# Patient Record
Sex: Male | Born: 1970 | Race: White | Hispanic: No | Marital: Single | Smoking: Former smoker
Health system: Southern US, Community
[De-identification: ages and names within clinical notes are randomized; demographics above are authoritative.]

## PROBLEM LIST (undated history)

## (undated) DIAGNOSIS — F419 Anxiety disorder, unspecified: Secondary | ICD-10-CM

## (undated) DIAGNOSIS — G4730 Sleep apnea, unspecified: Secondary | ICD-10-CM

## (undated) DIAGNOSIS — F32A Depression, unspecified: Secondary | ICD-10-CM

## (undated) DIAGNOSIS — Z8619 Personal history of other infectious and parasitic diseases: Secondary | ICD-10-CM

## (undated) DIAGNOSIS — F329 Major depressive disorder, single episode, unspecified: Secondary | ICD-10-CM

## (undated) HISTORY — PX: HERNIA REPAIR: SHX51

## (undated) HISTORY — DX: Sleep apnea, unspecified: G47.30

## (undated) HISTORY — DX: Depression, unspecified: F32.A

## (undated) HISTORY — DX: Personal history of other infectious and parasitic diseases: Z86.19

## (undated) HISTORY — DX: Anxiety disorder, unspecified: F41.9

## (undated) HISTORY — DX: Major depressive disorder, single episode, unspecified: F32.9

## (undated) HISTORY — PX: TONSILLECTOMY: SUR1361

## (undated) HISTORY — PX: INGUINAL HERNIA REPAIR: SUR1180

## (undated) HISTORY — PX: EYE SURGERY: SHX253

---

## 2001-10-10 ENCOUNTER — Encounter: Payer: Self-pay | Admitting: Emergency Medicine

## 2001-10-10 ENCOUNTER — Emergency Department (HOSPITAL_COMMUNITY): Admission: EM | Admit: 2001-10-10 | Discharge: 2001-10-10 | Payer: Self-pay | Admitting: Emergency Medicine

## 2014-04-23 ENCOUNTER — Emergency Department (HOSPITAL_BASED_OUTPATIENT_CLINIC_OR_DEPARTMENT_OTHER)
Admission: EM | Admit: 2014-04-23 | Discharge: 2014-04-23 | Disposition: A | Discharge status: Home / Self Care | Payer: BC Managed Care – PPO | Attending: Emergency Medicine | Admitting: Emergency Medicine

## 2014-04-23 ENCOUNTER — Encounter (HOSPITAL_BASED_OUTPATIENT_CLINIC_OR_DEPARTMENT_OTHER): Payer: Self-pay

## 2014-04-23 DIAGNOSIS — Z79899 Other long term (current) drug therapy: Secondary | ICD-10-CM | POA: Diagnosis not present

## 2014-04-23 DIAGNOSIS — E669 Obesity, unspecified: Secondary | ICD-10-CM | POA: Insufficient documentation

## 2014-04-23 DIAGNOSIS — K625 Hemorrhage of anus and rectum: Secondary | ICD-10-CM | POA: Insufficient documentation

## 2014-04-23 DIAGNOSIS — Z720 Tobacco use: Secondary | ICD-10-CM | POA: Insufficient documentation

## 2014-04-23 NOTE — ED Notes (Signed)
Pt reports straining while having a BM earlier. Reports there was blood in the toilet and on toilet paper.

## 2014-04-23 NOTE — Discharge Instructions (Signed)
Rectal Bleeding °Rectal bleeding is when blood passes out of the anus. It is usually a sign that something is wrong. It may not be serious, but it should always be evaluated. Rectal bleeding may present as bright red blood or extremely dark stools. The color may range from dark red or maroon to black (like tar). It is important that the cause of rectal bleeding be identified so treatment can be started and the problem corrected. °CAUSES  °· Hemorrhoids. These are enlarged (dilated) blood vessels or veins in the anal or rectal area. °· Fistulas. These are abnormal, burrowing channels that usually run from inside the rectum to the skin around the anus. They can bleed. °· Anal fissures. This is a tear in the tissue of the anus. Bleeding occurs with bowel movements. °· Diverticulosis. This is a condition in which pockets or sacs project from the bowel wall. Occasionally, the sacs can bleed. °· Diverticulitis. This is an infection involving diverticulosis of the colon. °· Proctitis and colitis. These are conditions in which the rectum, colon, or both, can become inflamed and pitted (ulcerated). °· Polyps and cancer. Polyps are non-cancerous (benign) growths in the colon that may bleed. Certain types of polyps turn into cancer. °· Protrusion of the rectum. Part of the rectum can project from the anus and bleed. °· Certain medicines. °· Intestinal infections. °· Blood vessel abnormalities. °HOME CARE INSTRUCTIONS °· Eat a high-fiber diet to keep your stool soft. °· Limit activity. °· Drink enough fluids to keep your urine clear or pale yellow. °· Warm baths may be useful to soothe rectal pain. °· Follow up with your caregiver as directed. °SEEK IMMEDIATE MEDICAL CARE IF: °· You develop increased bleeding. °· You have black or dark red stools. °· You vomit blood or material that looks like coffee grounds. °· You have abdominal pain or tenderness. °· You have a fever. °· You feel weak, nauseous, or you faint. °· You have  severe rectal pain or you are unable to have a bowel movement. °MAKE SURE YOU: °· Understand these instructions. °· Will watch your condition. °· Will get help right away if you are not doing well or get worse. °Document Released: 10/23/2001 Document Revised: 07/26/2011 Document Reviewed: 10/18/2010 °ExitCare® Patient Information ©2015 [employer]. This information is not intended to replace advice given to you by your health care provider. Make sure you discuss any questions you have with your health care provider. ° °

## 2014-04-23 NOTE — ED Provider Notes (Signed)
CSN: [id number]     Arrival date & time 04/23/14  1625 History   First MD Initiated Contact with Patient 04/23/14 1641     Chief Complaint  Patient presents with  . Rectal Bleeding      HPI Patient reports having a normal bowel movement this morning except that it was followed by a small amount of blood in the toilet as well as blood on the tissue.  No rectal bleeding since then.  No prior history of rectal bleeding.  Denies constipation.  States his stool was brown in color.  Denies abdominal pain.  No weakness or lightheadedness.  No use of anticoagulants.  No other complaints.   History reviewed. No pertinent past medical history. Past Surgical History  Procedure Laterality Date  . Eye surgery    . Tonsillectomy     No family history on file. History  Substance Use Topics  . Smoking status: Current Every Day Smoker  . Smokeless tobacco: Not on file  . Alcohol Use: No    Review of Systems  All other systems reviewed and are negative.     Allergies  Review of patient's allergies indicates no known allergies.  Home Medications   Prior to Admission medications   Medication Sig Start Date End Date Taking? Authorizing Provider  venlafaxine (EFFEXOR) 100 MG tablet Take 100 mg by mouth 2 (two) times daily.   Yes Historical Provider, MD   BP 131/76 mmHg  Pulse 99  Temp(Src) 98.2 F (36.8 C) (Oral)  Resp 20  Ht 5\' 10"  (1.778 m)  Wt 280 lb (127.007 kg)  BMI 40.18 kg/m2  SpO2 97% Physical Exam  Constitutional: He is oriented to person, place, and time. He appears well-developed and well-nourished.  HENT:  Head: Normocephalic and atraumatic.  Eyes: EOM are normal.  Neck: Normal range of motion.  Cardiovascular: Normal rate.   Pulmonary/Chest: Effort normal.  Abdominal: Soft. He exhibits no distension. There is no tenderness.  Obese  Genitourinary:  Small fissure noted without active bleeding.  No external hemorrhoids noted.  No masses noted on rectal examination.   No gross blood.  Brown stool.  Musculoskeletal: Normal range of motion.  Neurological: He is alert and oriented to person, place, and time.  Skin: Skin is warm and dry.  Psychiatric: He has a normal mood and affect. Judgment normal.  Nursing note and vitals reviewed.   ED Course  Procedures (including critical care time) Labs Review Labs Reviewed - No data to display  Imaging Review No results found.   EKG Interpretation None      MDM   Final diagnoses:  Rectal bleeding    Likely small internal hemorrhoid or small bleeding from fissure.  No active bleeding at this time.  Vital signs are normal.  Outpatient PCP follow-up.  GI referral only if his bleeding would be persistent.  Patient understands to return to the ER for new or worsening symptoms.    Lyanne CoKevin M Tiajah Oyster, MD 04/23/14 [phone]

## 2014-06-04 ENCOUNTER — Encounter: Payer: Self-pay | Admitting: Physician Assistant

## 2014-06-04 ENCOUNTER — Ambulatory Visit (INDEPENDENT_AMBULATORY_CARE_PROVIDER_SITE_OTHER): Payer: Self-pay | Admitting: Physician Assistant

## 2014-06-04 VITALS — BP 149/101 | HR 92 | Temp 98.2°F | Resp 18 | Ht 70.0 in | Wt 312.0 lb

## 2014-06-04 DIAGNOSIS — Z22322 Carrier or suspected carrier of Methicillin resistant Staphylococcus aureus: Secondary | ICD-10-CM | POA: Insufficient documentation

## 2014-06-04 DIAGNOSIS — R55 Syncope and collapse: Secondary | ICD-10-CM | POA: Insufficient documentation

## 2014-06-04 DIAGNOSIS — F329 Major depressive disorder, single episode, unspecified: Secondary | ICD-10-CM | POA: Insufficient documentation

## 2014-06-04 DIAGNOSIS — F32A Depression, unspecified: Secondary | ICD-10-CM | POA: Insufficient documentation

## 2014-06-04 DIAGNOSIS — F419 Anxiety disorder, unspecified: Principal | ICD-10-CM

## 2014-06-04 DIAGNOSIS — F418 Other specified anxiety disorders: Secondary | ICD-10-CM

## 2014-06-04 LAB — TSH: TSH: 1.55 u[IU]/mL (ref 0.35–4.50)

## 2014-06-04 LAB — BASIC METABOLIC PANEL
BUN: 16 mg/dL (ref 6–23)
CHLORIDE: 106 meq/L (ref 96–112)
CO2: 28 mEq/L (ref 19–32)
CREATININE: 1.01 mg/dL (ref 0.40–1.50)
Calcium: 9.4 mg/dL (ref 8.4–10.5)
GFR: 85.94 mL/min (ref >60.00)
Glucose, Bld: 98 mg/dL (ref 70–99)
Potassium: 4.2 mEq/L (ref 3.5–5.1)
Sodium: 136 mEq/L (ref 135–145)

## 2014-06-04 LAB — CBC
HCT: 49.4 % (ref 39.0–52.0)
Hemoglobin: 16.8 g/dL (ref 13.0–17.0)
MCHC: 34 g/dL (ref 30.0–36.0)
MCV: 92.5 fl (ref 78.0–100.0)
Platelets: 199 10*3/uL (ref 150.0–400.0)
RBC: 5.34 Mil/uL (ref 4.22–5.81)
RDW: 13.6 % (ref 11.5–15.5)
WBC: 5.4 10*3/uL (ref 4.0–10.5)

## 2014-06-04 MED ORDER — BUSPIRONE HCL 7.5 MG PO TABS: 7.5000 mg | ORAL_TABLET | Freq: Two times a day (BID) | ORAL | Status: DC

## 2014-06-04 MED ORDER — MUPIROCIN CALCIUM 2 % NA OINT
1.0000 | TOPICAL_OINTMENT | Freq: Two times a day (BID) | NASAL | Status: DC
Start: 2014-06-04 — End: 2015-01-14

## 2014-06-04 MED ORDER — MUPIROCIN 2 % EX OINT: 1.0000 "application " | TOPICAL_OINTMENT | Freq: Two times a day (BID) | CUTANEOUS | Status: DC

## 2014-06-04 NOTE — Progress Notes (Signed)
Pre visit review using our clinic review tool, if applicable. No additional management support is needed unless otherwise documented below in the visit note/SLS  

## 2014-06-04 NOTE — Patient Instructions (Signed)
Please continue the Effexor as directed.  Begin taking the BuSpar twice daily. I think this will help a lot with symptoms.  Follow-up with me for this in 1 month.  Return sooner if you notice any worsening of symptoms.   Please apply the Bactroban ointment as directed.  This will help get rid of MRSA colonization and reduce recurrence of skin lesions.  For the syncope, this seems related to over stimulation of the vagus nerve.  Stay well hydrated.  If you are starting to have a sneezing or coughing fit, I want your to sit down to reduce risk of syncope.  I think once anxiety is under control this will help some.  If still occurring, I will be sending you to a Neurologist for evaluation.  Vasovagal Syncope, Adult Syncope, commonly known as fainting, is a temporary loss of consciousness. It occurs when the blood flow to the brain is reduced. Vasovagal syncope (also called neurocardiogenic syncope) is a fainting spell in which the blood flow to the brain is reduced because of a sudden drop in heart rate and blood pressure. Vasovagal syncope occurs when the brain and the cardiovascular system (blood vessels) do not adequately communicate and respond to each other. This is the most common cause of fainting. It often occurs in response to fear or some other type of emotional or physical stress. The body has a reaction in which the heart starts beating too slowly or the blood vessels expand, reducing blood pressure. This type of fainting spell is generally considered harmless. However, injuries can occur if a person takes a sudden fall during a fainting spell.  CAUSES  Vasovagal syncope occurs when a person's blood pressure and heart rate decrease suddenly, usually in response to a trigger. Many things and situations can trigger an episode. Some of these include:   Pain.   Fear.   The sight of blood or medical procedures, such as blood being drawn from a vein.   Common activities, such as coughing,  swallowing, stretching, or going to the bathroom.   Emotional stress.   Prolonged standing, especially in a warm environment.   Lack of sleep or rest.   Prolonged lack of food.   Prolonged lack of fluids.   Recent illness.  The use of certain drugs that affect blood pressure, such as cocaine, alcohol, marijuana, inhalants, and opiates.  SYMPTOMS  Before the fainting episode, you may:   Feel dizzy or light headed.   Become pale.  Sense that you are going to faint.   Feel like the room is spinning.   Have tunnel vision, only seeing directly in front of you.   Feel sick to your stomach (nauseous).   See spots or slowly lose vision.   Hear ringing in your ears.   Have a headache.   Feel warm and sweaty.   Feel a sensation of pins and needles. During the fainting spell, you will generally be unconscious for no longer than a couple minutes before waking up and returning to normal. If you get up too quickly before your body can recover, you may faint again. Some twitching or jerky movements may occur during the fainting spell.  DIAGNOSIS  Your caregiver will ask about your symptoms, take a medical history, and perform a physical exam. Various tests may be done to rule out other causes of fainting. These may include blood tests and tests to check the heart, such as electrocardiography, echocardiography, and possibly an electrophysiology study. When other causes have  been ruled out, a test may be done to check the body's response to changes in position (tilt table test). TREATMENT  Most cases of vasovagal syncope do not require treatment. Your caregiver may recommend ways to avoid fainting triggers and may provide home strategies for preventing fainting. If you must be exposed to a possible trigger, you can drink additional fluids to help reduce your chances of having an episode of vasovagal syncope. If you have warning signs of an oncoming episode, you can respond  by positioning yourself favorably (lying down). If your fainting spells continue, you may be given medicines to prevent fainting. Some medicines may help make you more resistant to repeated episodes of vasovagal syncope. Special exercises or compression stockings may be recommended. In rare cases, the surgical placement of a pacemaker is considered. HOME CARE INSTRUCTIONS   Learn to identify the warning signs of vasovagal syncope.   Sit or lie down at the first warning sign of a fainting spell. If sitting, put your head down between your legs. If you lie down, swing your legs up in the air to increase blood flow to the brain.   Avoid hot tubs and saunas.  Avoid prolonged standing.  Drink enough fluids to keep your urine clear or pale yellow. Avoid caffeine.  Increase salt in your diet as directed by your caregiver.   If you have to stand for a long time, perform movements such as:   Crossing your legs.   Flexing and stretching your leg muscles.   Squatting.   Moving your legs.   Bending over.   Only take over-the-counter or prescription medicines as directed by your caregiver. Do not suddenly stop any medicines without asking your caregiver first. SEEK MEDICAL CARE IF:   Your fainting spells continue or happen more frequently in spite of treatment.   You lose consciousness for more than a couple minutes.  You have fainting spells during or after exercising or after being startled.   You have new symptoms that occur with the fainting spells, such as:   Shortness of breath.  Chest pain.   Irregular heartbeat.   You have episodes of twitching or jerky movements that last longer than a few seconds.  You have episodes of twitching or jerky movements without obvious fainting. SEEK IMMEDIATE MEDICAL CARE IF:   You have injuries or bleeding after a fainting spell.   You have episodes of twitching or jerky movements that last longer than 5 minutes.   You  have more than one spell of twitching or jerky movements before returning to consciousness after fainting. MAKE SURE YOU:   Understand these instructions.  Will watch your condition.  Will get help right away if you are not doing well or get worse. Document Released: 04/19/2012 Document Reviewed: 04/19/2012 [facility] Patient Information 2015 [place]Care, [place]. This information is not intended to replace advice given to you by your health care provider. Make sure you discuss any questions you have with your health care provider.

## 2014-06-04 NOTE — Progress Notes (Signed)
Patient presents to clinic today to establish care.  Acute Concerns: Patient complains of history of MRSA skin infection, usually presenting as a boil in the inguinal region.  Patient endorses a current lesion in the right inguinal area that is healing, but still tender. Denies fever, chills or malaise. Has underwent MRSA eradication therapy previously.  Patient also endorses intermittent episodes of syncope after fits of coughing or sneezing. Also endorses an episode while urinating. Denies lightheadedness, nausea or vomiting prior to these episodes. Denies anterograde and retrograde amnesia. Denies history of heart arrhythmia. Denies palpitations, chest pain or shortness of breath. Endorses he tries to stay well hydrated, but could do a better job. Endorses well-rounded diet. Symptoms first occurred greater than 2 years ago.  Chronic Issues: Anxiety and Depression -- patient currently on Effexor 150 mg daily. Endorses significant relief of depression with this medication. Denies much improvement in anxiety. Patient endorses generalized anxiety with occasional flareups in stressful situations. Denies panic attack. Denies somnolence on Effexor XR. Denies suicidal thought or ideation.  Past Medical History  Diagnosis Date  . History of chicken pox   . Anxiety and depression     Past Surgical History  Procedure Laterality Date  . Eye surgery      x4, [3]Left, [2] Right  . Tonsillectomy      No current outpatient prescriptions on file prior to visit.   No current facility-administered medications on file prior to visit.    Allergies  Allergen Reactions  . Poison Oak Extract [Extract Of Poison Oak] Anaphylaxis, Swelling and Rash    Family History  Problem Relation Age of Onset  . COPD Mother     Living  . Anxiety disorder Mother   . GER disease Mother   . Congestive Heart Failure Mother   . Sleep apnea Mother   . Arthritis Mother   . Kidney disease Mother   . Hypothyroidism  Mother   . Hyperlipidemia Mother   . Pulmonary embolism Mother   . Anemia Mother   . Heart attack Father [id number]    Deceased  . Deep vein thrombosis Father   . Mental illness Sister     History   Social History  . Marital Status: Single    Spouse Name: N/A    Number of Children: N/A  . Years of Education: N/A   Occupational History  . Not on file.   Social History Main Topics  . Smoking status: Current Every Day Smoker -- 0.50 packs/day for 7 years    Types: Cigarettes  . Smokeless tobacco: Never Used  . Alcohol Use: No  . Drug Use: No  . Sexual Activity: Not on file   Other Topics Concern  . Not on file   Social History Narrative   ROS Pertinent ROS are listed in the HPI.  BP 149/101 mmHg  Pulse 92  Temp(Src) 98.2 F (36.8 C) (Oral)  Resp 18  Ht 5\' 10"  (1.778 m)  Wt 312 lb (141.522 kg)  BMI 44.77 kg/m2  SpO2 97%  Physical Exam  Constitutional: He is oriented to person, place, and time and well-developed, well-nourished, and in no distress.  HENT:  Head: Normocephalic and atraumatic.  Right Ear: External ear normal.  Left Ear: External ear normal.  Nose: Nose normal.  Mouth/Throat: Oropharynx is clear and moist. No oropharyngeal exudate.  TM within normal limits bilaterally.  Eyes: Conjunctivae are normal.  Neck: Neck supple.  Cardiovascular: Normal rate, regular rhythm, normal heart sounds and intact distal  pulses.   Pulmonary/Chest: Effort normal and breath sounds normal. No respiratory distress. He has no wheezes. He has no rales. He exhibits no tenderness.  Neurological: He is alert and oriented to person, place, and time. No cranial nerve deficit.  Skin: Skin is warm and dry.  Presence of a healing lesion ofR inguinal region with mild tenderness, but without fluctuance, drainage, erythema or warmth.  Psychiatric: Affect normal.  Vitals reviewed.    Assessment/Plan: Anxiety and depression We'll continue Effexor XR 150 mg daily. We'll add BuSpar 7.5  mg twice a day for better control of anxiety. Common side effects of medications discussed with patient. Follow up in 1 month for repeat evaluation. Encouraged counseling to help with depression and anxiety. Patient declines at present. Handout for [employer] counselors given.   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 Rx Bactroban nasal ointment for eradication of MRSA colonization. Recommended referral to infectious disease. Patient declines at present. Supportive measures discussed with patient.   Vasovagal syncope EKG without concerning findings. Giving syncopal event occurs after forceful fits of coughing or sneezing, vasovagal syncope is most likely culprit. We'll obtain CBC, BMP and TSH today. Patient encouraged to increase fluid intake. Is starting to cough or sneeze, we'll need to sit down to prevent significant drop of blood pressure. Encouraged evaluation by neurology. Patient will give some thought.

## 2014-06-05 ENCOUNTER — Telehealth: Payer: Self-pay | Admitting: Physician Assistant

## 2014-06-05 NOTE — Telephone Encounter (Signed)
emmi emailed °

## 2014-06-05 NOTE — Telephone Encounter (Signed)
Error/gd °

## 2014-06-09 NOTE — Assessment & Plan Note (Signed)
We'll continue Effexor XR 150 mg daily. We'll add BuSpar 7.5 mg twice a day for better control of anxiety. Common side effects of medications discussed with patient. Follow up in 1 month for repeat evaluation. Encouraged counseling to help with depression and anxiety. Patient declines at present. Handout for [employer] counselors given.

## 2014-06-09 NOTE — Assessment & Plan Note (Signed)
With one healing lesion. Rx Bactroban nasal ointment for eradication of MRSA colonization. Recommended referral to infectious disease. Patient declines at present. Supportive measures discussed with patient.

## 2014-06-09 NOTE — Assessment & Plan Note (Signed)
EKG without concerning findings. Giving syncopal event occurs after forceful fits of coughing or sneezing, vasovagal syncope is most likely culprit. We'll obtain CBC, BMP and TSH today. Patient encouraged to increase fluid intake. Is starting to cough or sneeze, we'll need to sit down to prevent significant drop of blood pressure. Encouraged evaluation by neurology. Patient will give some thought.

## 2014-07-05 ENCOUNTER — Ambulatory Visit (INDEPENDENT_AMBULATORY_CARE_PROVIDER_SITE_OTHER): Payer: BLUE CROSS/BLUE SHIELD | Admitting: Physician Assistant

## 2014-07-05 ENCOUNTER — Encounter: Payer: Self-pay | Admitting: Physician Assistant

## 2014-07-05 VITALS — BP 145/79 | HR 94 | Temp 97.7°F | Resp 16 | Ht 70.0 in | Wt 316.0 lb

## 2014-07-05 DIAGNOSIS — R3915 Urgency of urination: Secondary | ICD-10-CM

## 2014-07-05 DIAGNOSIS — F419 Anxiety disorder, unspecified: Secondary | ICD-10-CM

## 2014-07-05 DIAGNOSIS — F32A Depression, unspecified: Secondary | ICD-10-CM

## 2014-07-05 DIAGNOSIS — F329 Major depressive disorder, single episode, unspecified: Secondary | ICD-10-CM

## 2014-07-05 DIAGNOSIS — N401 Enlarged prostate with lower urinary tract symptoms: Secondary | ICD-10-CM

## 2014-07-05 DIAGNOSIS — F418 Other specified anxiety disorders: Secondary | ICD-10-CM

## 2014-07-05 DIAGNOSIS — K640 First degree hemorrhoids: Secondary | ICD-10-CM

## 2014-07-05 LAB — PSA: PSA: 0.56 ng/mL (ref 0.10–4.00)

## 2014-07-05 LAB — URINALYSIS, ROUTINE W REFLEX MICROSCOPIC
Bilirubin Urine: NEGATIVE
Hgb urine dipstick: NEGATIVE
KETONES UR: NEGATIVE
LEUKOCYTES UA: NEGATIVE
Nitrite: NEGATIVE
PH: 6 (ref 5.0–8.0)
SPECIFIC GRAVITY, URINE: 1.02 (ref 1.000–1.030)
Total Protein, Urine: NEGATIVE
Urine Glucose: NEGATIVE
Urobilinogen, UA: 0.2 (ref 0.0–1.0)

## 2014-07-05 MED ORDER — TAMSULOSIN HCL 0.4 MG PO CAPS: 0.4000 mg | ORAL_CAPSULE | Freq: Every day | ORAL | Status: DC

## 2014-07-05 MED ORDER — VENLAFAXINE HCL ER 150 MG PO CP24: 150.0000 mg | ORAL_CAPSULE | Freq: Every day | ORAL | Status: DC

## 2014-07-05 MED ORDER — HYDROCORTISONE 1 % RE CREA: 1.0000 "application " | TOPICAL_CREAM | Freq: Two times a day (BID) | RECTAL | Status: DC

## 2014-07-05 NOTE — Progress Notes (Signed)
Patient presents to clinic today for 3343-month follow-up of anxiety and depression after the addition of Buspar 7.5 mg BID to his Effexor 150 mg daily.  Patient endorses improvement in mood.  Denies excessive anxiety.  Denies SI/HI.   Patient also complains of intermittent abdominal pain x 2.5 months described as "uncomfortable".   Endorses rare acid reflux but denies nausea or vomiting.  Endorses changing bowels -- spurts of constipation followed by diarrhea.  Endorses noting intermittent bright red blood on toilet paper with wiping.  Denies melena or tenesmus. Denies family history of colon cancer. Has never had of colonoscopy. Does endorse urinary frequency and incomplete bladder emptying, with nocturia x 5-6.  Denies hx of enlarged prostate.  Past Medical History  Diagnosis Date  . History of chicken pox   . Anxiety and depression     Current Outpatient Prescriptions on File Prior to Visit  Medication Sig Dispense Refill  . busPIRone (BUSPAR) 7.5 MG tablet Take 1 tablet (7.5 mg total) by mouth 2 (two) times daily. 60 tablet 1  . magnesium 30 MG tablet Take 30 mg by mouth daily.    . mupirocin nasal ointment (BACTROBAN) 2 % Place 1 application into the nose 2 (two) times daily. Use one-half of tube in each nostril twice daily for five (5) days. After application, press sides of nose together and gently massage. 10 g 0  . pyridOXINE (VITAMIN B-6) 25 MG tablet Take 25 mg by mouth daily.    Marland Kitchen. zinc gluconate 50 MG tablet Take 50 mg by mouth daily.     No current facility-administered medications on file prior to visit.    Allergies  Allergen Reactions  . Poison Oak Extract [Extract Of Poison Oak] Anaphylaxis, Swelling and Rash    Family History  Problem Relation Age of Onset  . COPD Mother     Living  . Anxiety disorder Mother   . GER disease Mother   . Congestive Heart Failure Mother   . Sleep apnea Mother   . Arthritis Mother   . Kidney disease Mother   . Hypothyroidism Mother    . Hyperlipidemia Mother   . Pulmonary embolism Mother   . Anemia Mother   . Heart attack Father [id number]    Deceased  . Deep vein thrombosis Father   . Mental illness Sister     History   Social History  . Marital Status: Single    Spouse Name: N/A  . Number of Children: N/A  . Years of Education: N/A   Social History Main Topics  . Smoking status: Current Every Day Smoker -- 0.50 packs/day for 7 years    Types: Cigarettes  . Smokeless tobacco: Never Used  . Alcohol Use: No  . Drug Use: No  . Sexual Activity: Not on file   Other Topics Concern  . None   Social History Narrative   Review of Systems - See HPI.  All other ROS are negative.  BP 145/79 mmHg  Pulse 94  Temp(Src) 97.7 F (36.5 C) (Oral)  Resp 16  Ht 5\' 10"  (1.778 m)  Wt 316 lb (143.337 kg)  BMI 45.34 kg/m2  SpO2 96%  Physical Exam  Constitutional: He is oriented to person, place, and time and well-developed, well-nourished, and in no distress.  HENT:  Head: Normocephalic and atraumatic.  Cardiovascular: Normal rate, regular rhythm, normal heart sounds and intact distal pulses.   Pulmonary/Chest: Effort normal and breath sounds normal. No respiratory distress. He has no wheezes.  He has no rales. He exhibits no tenderness.  Abdominal: Soft. Bowel sounds are normal. He exhibits no distension and no mass. There is no tenderness. There is no rebound and no guarding.  Genitourinary: Prostate is enlarged. Prostate is not tender.  Neurological: He is alert and oriented to person, place, and time.  Skin: Skin is warm and dry. No rash noted.  Psychiatric: Affect normal.  Vitals reviewed.  Recent Results (from the past 2160 hour(s))  CBC     Status: None   Collection Time: 06/04/14  8:45 AM  Result Value Ref Range   WBC 5.4 4.0 - 10.5 K/uL   RBC 5.34 4.22 - 5.81 Mil/uL   Platelets 199.0 150.0 - 400.0 K/uL   Hemoglobin 16.8 13.0 - 17.0 g/dL   HCT 16.1 09.6 - 04.5 %   MCV 92.5 78.0 - 100.0 fl   MCHC 34.0 30.0  - 36.0 g/dL   RDW 40.9 81.1 - 91.4 %  Basic Metabolic Panel (BMET)     Status: None   Collection Time: 06/04/14  8:45 AM  Result Value Ref Range   Sodium 136 135 - 145 mEq/L   Potassium 4.2 3.5 - 5.1 mEq/L   Chloride 106 96 - 112 mEq/L   CO2 28 19 - 32 mEq/L   Glucose, Bld 98 70 - 99 mg/dL   BUN 16 6 - 23 mg/dL   Creatinine, Ser 7.82 0.40 - 1.50 mg/dL   Calcium 9.4 8.4 - 95.6 mg/dL   GFR 21.30 >86.57 mL/min  TSH     Status: None   Collection Time: 06/04/14  8:45 AM  Result Value Ref Range   TSH 1.55 0.35 - 4.50 uIU/mL    Assessment/Plan: First degree hemorrhoids Rx Proctocort for mild hemorrhoid. Increase fluids.  Stool softener and sitz baths recommended.  If no improvement, will need assessment by GI.   Benign prostatic hyperplasia (BPH) with urinary urgency Enlargement noted on DRE without palpable nodule.  Will obtain PSA.  Giving LUTS will check UA and start Flomax 0.4 mg daily.  Follow-up in 1 month.   Anxiety and depression Doing well at present. Will continue current regimen.  Follow-up 6 months unless anything changes.

## 2014-07-05 NOTE — Progress Notes (Signed)
Pre visit review using our clinic review tool, if applicable. No additional management support is needed unless otherwise documented below in the visit note/SLS  

## 2014-07-05 NOTE — Assessment & Plan Note (Signed)
Doing well at present. Will continue current regimen.  Follow-up 6 months unless anything changes.

## 2014-07-05 NOTE — Patient Instructions (Signed)
Please continue the Effexor and BuSpar as directed.  I am glad you are doing better.  For the rectal bleeding, please use the steroid rectal cream twice daily for 6 days.  Increase your fluid intake and begin a fiber supplement.  Use stool softener when you notice hard stools.  If symptoms are not improving, we will have to set you up with a Gastroenterologist.  Please stop by the lab for blood work to further assess your prostate.  I can feel enlargement on exam, which would explain the symptoms you are noticing. Please start the Flomax daily, taking the first dose in the evening and the first dose can cause some lightheadedness.  Follow-up in 1 month.

## 2014-07-05 NOTE — Assessment & Plan Note (Signed)
Enlargement noted on DRE without palpable nodule.  Will obtain PSA.  Giving LUTS will check UA and start Flomax 0.4 mg daily.  Follow-up in 1 month.

## 2014-07-05 NOTE — Assessment & Plan Note (Signed)
Rx Proctocort for mild hemorrhoid. Increase fluids.  Stool softener and sitz baths recommended.  If no improvement, will need assessment by GI.

## 2014-07-23 ENCOUNTER — Telehealth: Payer: Self-pay | Admitting: *Deleted

## 2014-07-23 NOTE — Telephone Encounter (Signed)
Opened in error

## 2014-08-05 ENCOUNTER — Encounter: Payer: Self-pay | Admitting: Physician Assistant

## 2014-08-05 ENCOUNTER — Ambulatory Visit (INDEPENDENT_AMBULATORY_CARE_PROVIDER_SITE_OTHER): Payer: 59 | Admitting: Physician Assistant

## 2014-08-05 VITALS — BP 126/84 | HR 84 | Temp 98.4°F | Resp 18 | Ht 70.0 in | Wt 308.4 lb

## 2014-08-05 DIAGNOSIS — R3915 Urgency of urination: Secondary | ICD-10-CM

## 2014-08-05 DIAGNOSIS — N401 Enlarged prostate with lower urinary tract symptoms: Secondary | ICD-10-CM

## 2014-08-05 NOTE — Progress Notes (Signed)
Patient presents to clinic today for 52-month follow-up of BPH after initiating Flomax 0.4 mg daily. Is taking medication as directed.  Endorses nocturia x 2 which is markedly improved from 7-8 times per night. Endorses good bladder emptying. Patient denies chest pain, palpitations, lightheadedness, dizziness, vision changes or frequent headaches. BP actually improved on Flomax.  Past Medical History  Diagnosis Date  . History of chicken pox   . Anxiety and depression     Current Outpatient Prescriptions on File Prior to Visit  Medication Sig Dispense Refill  . busPIRone (BUSPAR) 7.5 MG tablet Take 1 tablet (7.5 mg total) by mouth 2 (two) times daily. 60 tablet 1  . hydrocortisone (PROCTOCORT) 1 % CREA Apply 1 application topically 2 (two) times daily. 1 Tube 0  . magnesium 30 MG tablet Take 30 mg by mouth daily.    . mupirocin nasal ointment (BACTROBAN) 2 % Place 1 application into the nose 2 (two) times daily. Use one-half of tube in each nostril twice daily for five (5) days. After application, press sides of nose together and gently massage. 10 g 0  . pyridOXINE (VITAMIN B-6) 25 MG tablet Take 25 mg by mouth daily.    . tamsulosin (FLOMAX) 0.4 MG CAPS capsule Take 1 capsule (0.4 mg total) by mouth daily. 30 capsule 3  . venlafaxine XR (EFFEXOR-XR) 150 MG 24 hr capsule Take 1 capsule (150 mg total) by mouth daily with breakfast. 90 capsule 1  . zinc gluconate 50 MG tablet Take 50 mg by mouth daily.     No current facility-administered medications on file prior to visit.    Allergies  Allergen Reactions  . Poison Oak Extract [Extract Of Poison Oak] Anaphylaxis, Swelling and Rash    Family History  Problem Relation Age of Onset  . COPD Mother     Living  . Anxiety disorder Mother   . GER disease Mother   . Congestive Heart Failure Mother   . Sleep apnea Mother   . Arthritis Mother   . Kidney disease Mother   . Hypothyroidism Mother   . Hyperlipidemia Mother   . Pulmonary  embolism Mother   . Anemia Mother   . Heart attack Father 40    Deceased  . Deep vein thrombosis Father   . Mental illness Sister     History   Social History  . Marital Status: Single    Spouse Name: N/A  . Number of Children: N/A  . Years of Education: N/A   Social History Main Topics  . Smoking status: Current Every Day Smoker -- 0.50 packs/day for 7 years    Types: Cigarettes  . Smokeless tobacco: Never Used  . Alcohol Use: No  . Drug Use: No  . Sexual Activity: Not on file   Other Topics Concern  . None   Social History Narrative   Review of Systems - See HPI.  All other ROS are negative.  BP 126/84 mmHg  Pulse 84  Temp(Src) 98.4 F (36.9 C) (Oral)  Resp 18  Ht  (1.778 m)  Wt 308 lb 6 oz (139.878 kg)  BMI 44.25 kg/m2  SpO2 97%  Physical Exam  Constitutional: He is oriented to person, place, and time and well-developed, well-nourished, and in no distress.  HENT:  Head: Normocephalic and atraumatic.  Eyes: Conjunctivae are normal. Pupils are equal, round, and reactive to light.  Neck: Neck supple. No thyromegaly present.  Cardiovascular: Normal rate, regular rhythm, normal heart sounds and intact  distal pulses.   Pulmonary/Chest: Effort normal and breath sounds normal. No respiratory distress. He has no wheezes. He has no rales. He exhibits no tenderness.  Lymphadenopathy:    He has no cervical adenopathy.  Neurological: He is alert and oriented to person, place, and time.  Skin: Skin is warm and dry. No rash noted.  Psychiatric: Affect normal.  Vitals reviewed.   Recent Results (from the past 2160 hour(s))  CBC     Status: None   Collection Time: 06/04/14  8:45 AM  Result Value Ref Range   WBC 5.4 4.0 - 10.5 K/uL   RBC 5.34 4.22 - 5.81 Mil/uL   Platelets 199.0 150.0 - 400.0 K/uL   Hemoglobin 16.8 13.0 - 17.0 g/dL   HCT 16.149.4 09.639.0 - 04.552.0 %   MCV 92.5 78.0 - 100.0 fl   MCHC 34.0 30.0 - 36.0 g/dL   RDW 40.913.6 81.111.5 - 91.415.5 %  Basic Metabolic  Panel (BMET)     Status: None   Collection Time: 06/04/14  8:45 AM  Result Value Ref Range   Sodium 136 135 - 145 mEq/L   Potassium 4.2 3.5 - 5.1 mEq/L   Chloride 106 96 - 112 mEq/L   CO2 28 19 - 32 mEq/L   Glucose, Bld 98 70 - 99 mg/dL   BUN 16 6 - 23 mg/dL   Creatinine, Ser 7.821.01 0.40 - 1.50 mg/dL   Calcium 9.4 8.4 - 95.610.5 mg/dL   GFR 21.3085.94 >86.57>60.00 mL/min  TSH     Status: None   Collection Time: 06/04/14  8:45 AM  Result Value Ref Range   TSH 1.55 0.35 - 4.50 uIU/mL  PSA     Status: None   Collection Time: 07/05/14  7:57 AM  Result Value Ref Range   PSA 0.56 0.10 - 4.00 ng/mL  Urinalysis, Routine w reflex microscopic     Status: Abnormal   Collection Time: 07/05/14  7:57 AM  Result Value Ref Range   Color, Urine YELLOW Yellow;Lt. Yellow   APPearance CLEAR Clear   Specific Gravity, Urine 1.020 1.000-1.030   pH 6.0 5.0 - 8.0   Total Protein, Urine NEGATIVE Negative   Urine Glucose NEGATIVE Negative   Ketones, ur NEGATIVE Negative   Bilirubin Urine NEGATIVE Negative   Hgb urine dipstick NEGATIVE Negative   Urobilinogen, UA 0.2 0.0 - 1.0   Leukocytes, UA NEGATIVE Negative   Nitrite NEGATIVE Negative   WBC, UA 0-2/hpf 0-2/hpf   RBC / HPF 0-2/hpf 0-2/hpf   Squamous Epithelial / LPF Rare(0-4/hpf) Rare(0-4/hpf)   Sperm, UA Presence of (A) None    Assessment/Plan: Benign prostatic hyperplasia (BPH) with urinary urgency Improved.  Nocturia 1-2 / night. Will change Flomax back to QHS dosing to reduce chance of side effect.  Follow-up in 6 months.

## 2014-08-05 NOTE — Patient Instructions (Signed)
Please continue medications as directed. Take the Flomax in the evening instead of daytime. Stay well hydrated.  Follow-up in 6 months for anxiety and BPH.

## 2014-08-05 NOTE — Progress Notes (Signed)
Pre visit review using our clinic review tool, if applicable. No additional management support is needed unless otherwise documented below in the visit note/SLS  

## 2014-08-05 NOTE — Assessment & Plan Note (Signed)
Improved.  Nocturia 1-2 / night. Will change Flomax back to QHS dosing to reduce chance of side effect.  Follow-up in 6 months.

## 2014-08-10 ENCOUNTER — Other Ambulatory Visit: Payer: Self-pay | Admitting: Physician Assistant

## 2014-08-12 NOTE — Telephone Encounter (Signed)
Refill sent per [facility] refill protocol/SLS  

## 2014-09-06 ENCOUNTER — Other Ambulatory Visit: Payer: Self-pay | Admitting: Physician Assistant

## 2014-11-14 ENCOUNTER — Other Ambulatory Visit: Payer: Self-pay | Admitting: Physician Assistant

## 2014-12-13 ENCOUNTER — Other Ambulatory Visit: Payer: Self-pay | Admitting: Physician Assistant

## 2014-12-16 ENCOUNTER — Encounter (HOSPITAL_COMMUNITY): Payer: Self-pay | Admitting: *Deleted

## 2014-12-16 ENCOUNTER — Encounter: Payer: Self-pay | Admitting: Internal Medicine

## 2014-12-16 ENCOUNTER — Emergency Department (HOSPITAL_COMMUNITY)
Admission: EM | Admit: 2014-12-16 | Discharge: 2014-12-16 | Disposition: A | Discharge status: Home / Self Care | Payer: 59 | Attending: Emergency Medicine | Admitting: Emergency Medicine

## 2014-12-16 ENCOUNTER — Emergency Department (HOSPITAL_COMMUNITY): Payer: 59

## 2014-12-16 ENCOUNTER — Emergency Department (HOSPITAL_COMMUNITY): Payer: 59 | Admitting: Anesthesiology

## 2014-12-16 ENCOUNTER — Ambulatory Visit (INDEPENDENT_AMBULATORY_CARE_PROVIDER_SITE_OTHER): Payer: 59 | Admitting: Internal Medicine

## 2014-12-16 ENCOUNTER — Encounter (HOSPITAL_COMMUNITY)
Admission: EM | Disposition: A | Discharge status: Home / Self Care | Payer: Self-pay | Source: Home / Self Care | Attending: Emergency Medicine

## 2014-12-16 VITALS — BP 118/76 | HR 78 | Temp 98.1°F | Ht 70.0 in | Wt 309.4 lb

## 2014-12-16 DIAGNOSIS — Z79899 Other long term (current) drug therapy: Secondary | ICD-10-CM | POA: Diagnosis not present

## 2014-12-16 DIAGNOSIS — L02414 Cutaneous abscess of left upper limb: Secondary | ICD-10-CM | POA: Diagnosis not present

## 2014-12-16 DIAGNOSIS — Z7982 Long term (current) use of aspirin: Secondary | ICD-10-CM | POA: Diagnosis not present

## 2014-12-16 DIAGNOSIS — Z7952 Long term (current) use of systemic steroids: Secondary | ICD-10-CM | POA: Diagnosis not present

## 2014-12-16 DIAGNOSIS — F419 Anxiety disorder, unspecified: Secondary | ICD-10-CM | POA: Insufficient documentation

## 2014-12-16 DIAGNOSIS — F329 Major depressive disorder, single episode, unspecified: Secondary | ICD-10-CM | POA: Diagnosis not present

## 2014-12-16 DIAGNOSIS — R609 Edema, unspecified: Secondary | ICD-10-CM

## 2014-12-16 DIAGNOSIS — F17210 Nicotine dependence, cigarettes, uncomplicated: Secondary | ICD-10-CM | POA: Insufficient documentation

## 2014-12-16 HISTORY — PX: I & D EXTREMITY: SHX5045

## 2014-12-16 LAB — COMPREHENSIVE METABOLIC PANEL
ALBUMIN: 3.7 g/dL (ref 3.5–5.0)
ALK PHOS: 64 U/L (ref 38–126)
ALT: 45 U/L (ref 17–63)
AST: 35 U/L (ref 15–41)
Anion gap: 5 (ref 5–15)
BUN: <5 mg/dL — ABNORMAL LOW (ref 6–20)
CO2: 27 mmol/L (ref 22–32)
CREATININE: 1.15 mg/dL (ref 0.61–1.24)
Calcium: 8.8 mg/dL — ABNORMAL LOW (ref 8.9–10.3)
Chloride: 104 mmol/L (ref 101–111)
GFR calc Af Amer: >60 mL/min (ref >60)
GFR calc non Af Amer: >60 mL/min (ref >60)
GLUCOSE: 97 mg/dL (ref 65–99)
Potassium: 4.3 mmol/L (ref 3.5–5.1)
Sodium: 136 mmol/L (ref 135–145)
Total Bilirubin: 0.4 mg/dL (ref 0.3–1.2)
Total Protein: 7.8 g/dL (ref 6.5–8.1)

## 2014-12-16 LAB — CBC WITH DIFFERENTIAL/PLATELET
Basophils Absolute: 0 10*3/uL (ref 0.0–0.1)
Basophils Relative: 0 % (ref 0–1)
Eosinophils Absolute: 0.3 10*3/uL (ref 0.0–0.7)
Eosinophils Relative: 3 % (ref 0–5)
HCT: 41.2 % (ref 39.0–52.0)
Hemoglobin: 14.4 g/dL (ref 13.0–17.0)
Lymphocytes Relative: 18 % (ref 12–46)
Lymphs Abs: 1.6 10*3/uL (ref 0.7–4.0)
MCH: 31.6 pg (ref 26.0–34.0)
MCHC: 35 g/dL (ref 30.0–36.0)
MCV: 90.5 fL (ref 78.0–100.0)
Monocytes Absolute: 1 10*3/uL (ref 0.1–1.0)
Monocytes Relative: 11 % (ref 3–12)
NEUTROS ABS: 5.7 10*3/uL (ref 1.7–7.7)
Neutrophils Relative %: 68 % (ref 43–77)
PLATELETS: 216 10*3/uL (ref 150–400)
RBC: 4.55 MIL/uL (ref 4.22–5.81)
RDW: 13.1 % (ref 11.5–15.5)
WBC: 8.5 10*3/uL (ref 4.0–10.5)

## 2014-12-16 SURGERY — IRRIGATION AND DEBRIDEMENT EXTREMITY
Anesthesia: General | Site: Arm Upper | Laterality: Left

## 2014-12-16 MED ORDER — OXYCODONE HCL 5 MG/5ML PO SOLN: 5.0000 mg | Freq: Once | ORAL | Status: DC | PRN

## 2014-12-16 MED ORDER — HYDROMORPHONE HCL 1 MG/ML IJ SOLN: 0.2500 mg | INTRAMUSCULAR | Status: DC | PRN

## 2014-12-16 MED ORDER — PROPOFOL 10 MG/ML IV BOLUS
INTRAVENOUS | Status: AC
  Filled 2014-12-16: qty 20

## 2014-12-16 MED ORDER — LIDOCAINE HCL (CARDIAC) 20 MG/ML IV SOLN
INTRAVENOUS | Status: AC
  Filled 2014-12-16: qty 5

## 2014-12-16 MED ORDER — FENTANYL CITRATE (PF) 100 MCG/2ML IJ SOLN
INTRAMUSCULAR | Status: DC | PRN
  Administered 2014-12-16 (×5): 50 ug via INTRAVENOUS

## 2014-12-16 MED ORDER — SODIUM CHLORIDE 0.9 % IR SOLN
Status: DC | PRN
  Administered 2014-12-16: 1

## 2014-12-16 MED ORDER — DOCUSATE SODIUM 100 MG PO CAPS: 100.0000 mg | ORAL_CAPSULE | Freq: Two times a day (BID) | ORAL | Status: DC

## 2014-12-16 MED ORDER — ONDANSETRON HCL 4 MG/2ML IJ SOLN
INTRAMUSCULAR | Status: DC | PRN
  Administered 2014-12-16: 4 mg via INTRAVENOUS

## 2014-12-16 MED ORDER — DOXYCYCLINE HYCLATE 100 MG PO TABS: 100.0000 mg | ORAL_TABLET | Freq: Two times a day (BID) | ORAL | Status: DC

## 2014-12-16 MED ORDER — LACTATED RINGERS IV SOLN: INTRAVENOUS | Status: DC

## 2014-12-16 MED ORDER — ONDANSETRON HCL 4 MG/2ML IJ SOLN: 4.0000 mg | Freq: Four times a day (QID) | INTRAMUSCULAR | Status: DC | PRN

## 2014-12-16 MED ORDER — IOHEXOL 300 MG/ML  SOLN
100.0000 mL | Freq: Once | INTRAMUSCULAR | Status: AC | PRN
  Administered 2014-12-16: 100 mL via INTRAVENOUS

## 2014-12-16 MED ORDER — VANCOMYCIN HCL 500 MG IV SOLR
500.0000 mg | Freq: Once | INTRAVENOUS | Status: AC
  Administered 2014-12-16: 500 mg via INTRAVENOUS
  Filled 2014-12-16 (×2): qty 500

## 2014-12-16 MED ORDER — ARTIFICIAL TEARS OP OINT
TOPICAL_OINTMENT | OPHTHALMIC | Status: AC
  Filled 2014-12-16: qty 3.5

## 2014-12-16 MED ORDER — OXYCODONE-ACETAMINOPHEN 10-325 MG PO TABS: 1.0000 | ORAL_TABLET | ORAL | Status: DC | PRN

## 2014-12-16 MED ORDER — ONDANSETRON HCL 4 MG/2ML IJ SOLN
INTRAMUSCULAR | Status: AC
  Filled 2014-12-16: qty 2

## 2014-12-16 MED ORDER — MIDAZOLAM HCL 5 MG/5ML IJ SOLN
INTRAMUSCULAR | Status: DC | PRN
Start: 2014-12-16 — End: 2014-12-16
  Administered 2014-12-16: 2 mg via INTRAVENOUS

## 2014-12-16 MED ORDER — MIDAZOLAM HCL 2 MG/2ML IJ SOLN
INTRAMUSCULAR | Status: AC
  Filled 2014-12-16: qty 4

## 2014-12-16 MED ORDER — LIDOCAINE HCL (CARDIAC) 20 MG/ML IV SOLN
INTRAVENOUS | Status: DC | PRN
  Administered 2014-12-16: 80 mg via INTRAVENOUS

## 2014-12-16 MED ORDER — VANCOMYCIN HCL 500 MG IV SOLR
500.0000 mg | Freq: Once | INTRAVENOUS | Status: AC
  Administered 2014-12-16: 500 mg via INTRAVENOUS
  Filled 2014-12-16: qty 500

## 2014-12-16 MED ORDER — OXYCODONE HCL 5 MG PO TABS: 5.0000 mg | ORAL_TABLET | Freq: Once | ORAL | Status: DC | PRN

## 2014-12-16 MED ORDER — PROPOFOL 10 MG/ML IV BOLUS
INTRAVENOUS | Status: DC | PRN
  Administered 2014-12-16 (×3): 20 mg via INTRAVENOUS
  Administered 2014-12-16: 200 mg via INTRAVENOUS

## 2014-12-16 MED ORDER — LACTATED RINGERS IV SOLN
INTRAVENOUS | Status: DC | PRN
  Administered 2014-12-16: 18:00:00 via INTRAVENOUS

## 2014-12-16 MED ORDER — FENTANYL CITRATE (PF) 250 MCG/5ML IJ SOLN
INTRAMUSCULAR | Status: AC
  Filled 2014-12-16: qty 5

## 2014-12-16 SURGICAL SUPPLY — 55 items
BANDAGE ELASTIC 3 VELCRO ST LF (GAUZE/BANDAGES/DRESSINGS) ×2 IMPLANT
BANDAGE ELASTIC 4 VELCRO ST LF (GAUZE/BANDAGES/DRESSINGS) ×2 IMPLANT
BNDG COHESIVE 1X5 TAN STRL LF (GAUZE/BANDAGES/DRESSINGS) IMPLANT
BNDG CONFORM 2 STRL LF (GAUZE/BANDAGES/DRESSINGS) IMPLANT
BNDG ESMARK 4X9 LF (GAUZE/BANDAGES/DRESSINGS) ×2 IMPLANT
BNDG GAUZE ELAST 4 BULKY (GAUZE/BANDAGES/DRESSINGS) ×2 IMPLANT
CORDS BIPOLAR (ELECTRODE) ×2 IMPLANT
COVER SURGICAL LIGHT HANDLE (MISCELLANEOUS) ×2 IMPLANT
CUFF TOURNIQUET SINGLE 18IN (TOURNIQUET CUFF) ×2 IMPLANT
CUFF TOURNIQUET SINGLE 24IN (TOURNIQUET CUFF) IMPLANT
DRAIN PENROSE 1/4X12 LTX STRL (WOUND CARE) IMPLANT
DRAPE SURG 17X23 STRL (DRAPES) ×2 IMPLANT
DRSG ADAPTIC 3X8 NADH LF (GAUZE/BANDAGES/DRESSINGS) ×2 IMPLANT
ELECT REM PT RETURN 9FT ADLT (ELECTROSURGICAL)
ELECTRODE REM PT RTRN 9FT ADLT (ELECTROSURGICAL) IMPLANT
GAUZE SPONGE 4X4 12PLY STRL (GAUZE/BANDAGES/DRESSINGS) ×2 IMPLANT
GAUZE XEROFORM 1X8 LF (GAUZE/BANDAGES/DRESSINGS) ×2 IMPLANT
GAUZE XEROFORM 5X9 LF (GAUZE/BANDAGES/DRESSINGS) IMPLANT
GLOVE BIOGEL PI IND STRL 8.5 (GLOVE) ×1 IMPLANT
GLOVE BIOGEL PI INDICATOR 8.5 (GLOVE) ×1
GLOVE SURG ORTHO 8.0 STRL STRW (GLOVE) ×2 IMPLANT
GOWN STRL REUS W/ TWL LRG LVL3 (GOWN DISPOSABLE) ×3 IMPLANT
GOWN STRL REUS W/ TWL XL LVL3 (GOWN DISPOSABLE) ×1 IMPLANT
GOWN STRL REUS W/TWL LRG LVL3 (GOWN DISPOSABLE) ×3
GOWN STRL REUS W/TWL XL LVL3 (GOWN DISPOSABLE) ×1
HANDPIECE INTERPULSE COAX TIP (DISPOSABLE)
KIT BASIN OR (CUSTOM PROCEDURE TRAY) ×2 IMPLANT
KIT ROOM TURNOVER OR (KITS) ×2 IMPLANT
MANIFOLD NEPTUNE II (INSTRUMENTS) ×2 IMPLANT
NEEDLE HYPO 25GX1X1/2 BEV (NEEDLE) IMPLANT
NS IRRIG 1000ML POUR BTL (IV SOLUTION) ×2 IMPLANT
PACK ORTHO EXTREMITY (CUSTOM PROCEDURE TRAY) ×2 IMPLANT
PAD ARMBOARD 7.5X6 YLW CONV (MISCELLANEOUS) ×4 IMPLANT
PAD CAST 4YDX4 CTTN HI CHSV (CAST SUPPLIES) ×1 IMPLANT
PADDING CAST COTTON 4X4 STRL (CAST SUPPLIES) ×1
PADDING CAST SYNTHETIC 4 (CAST SUPPLIES) ×1
PADDING CAST SYNTHETIC 4X4 STR (CAST SUPPLIES) ×1 IMPLANT
SET HNDPC FAN SPRY TIP SCT (DISPOSABLE) IMPLANT
SOAP 2 % CHG 4 OZ (WOUND CARE) ×2 IMPLANT
SPLINT FIBERGLASS 3X35 (CAST SUPPLIES) ×2 IMPLANT
SPONGE GAUZE 4X4 12PLY STER LF (GAUZE/BANDAGES/DRESSINGS) ×2 IMPLANT
SPONGE LAP 18X18 X RAY DECT (DISPOSABLE) ×2 IMPLANT
SPONGE LAP 4X18 X RAY DECT (DISPOSABLE) ×2 IMPLANT
SUCTION FRAZIER TIP 10 FR DISP (SUCTIONS) ×2 IMPLANT
SUT ETHILON 4 0 PS 2 18 (SUTURE) IMPLANT
SUT ETHILON 5 0 P 3 18 (SUTURE)
SUT NYLON ETHILON 5-0 P-3 1X18 (SUTURE) IMPLANT
SYR CONTROL 10ML LL (SYRINGE) IMPLANT
TOWEL OR 17X24 6PK STRL BLUE (TOWEL DISPOSABLE) ×2 IMPLANT
TOWEL OR 17X26 10 PK STRL BLUE (TOWEL DISPOSABLE) ×2 IMPLANT
TUBE ANAEROBIC SPECIMEN COL (MISCELLANEOUS) IMPLANT
TUBE CONNECTING 12X1/4 (SUCTIONS) ×2 IMPLANT
UNDERPAD 30X30 INCONTINENT (UNDERPADS AND DIAPERS) ×2 IMPLANT
WATER STERILE IRR 1000ML POUR (IV SOLUTION) ×2 IMPLANT
YANKAUER SUCT BULB TIP NO VENT (SUCTIONS) ×2 IMPLANT

## 2014-12-16 NOTE — H&P (Signed)
Stuart Taylor is an 44 y.o. male.   Chief Complaint: left arm pain and swelling HPI: Stuart Taylor is a 44 y.o. male who presents to the Emergency Department complaining of an abscess to the left arm onset 1 weeks ago. He notes the abscess began as a small "marble" has grown in size since then. He has applied ice packs with no relief. Pt notes an associated fever, TMax 101. He has ben to his PCP as 9 AM, as well as an orthopedist at 4 who felt it was too close to a nerve bundle to try anything with it. It was recommended he go to the ED. Pt denies any erythema or tick bites anywhere on his body. Pt denies abdominal pain, HA, bleeding. Pt denies PMHx of HIV, TB, or Syphilis. Pain is constant, worse with palpation and not associated with IVDU or trauma.   Past Medical History  Diagnosis Date  . History of chicken pox   . Anxiety and depression     Past Surgical History  Procedure Laterality Date  . Eye surgery      x4, [3]Left, [2] Right  . Tonsillectomy      Family History  Problem Relation Age of Onset  . COPD Mother     Living  . Anxiety disorder Mother   . GER disease Mother   . Congestive Heart Failure Mother   . Sleep apnea Mother   . Arthritis Mother   . Kidney disease Mother   . Hypothyroidism Mother   . Hyperlipidemia Mother   . Pulmonary embolism Mother   . Anemia Mother   . Heart attack Father 44    Deceased  . Deep vein thrombosis Father   . Mental illness Sister    Social History:  reports that he has been smoking Cigarettes.  He has a 3.5 pack-year smoking history. He has never used smokeless tobacco. He reports that he does not drink alcohol or use illicit drugs.  Allergies:  Allergies  Allergen Reactions  . Poison Oak Extract [Extract Of Poison Oak] Anaphylaxis, Swelling and Rash    Medications Prior to Admission  Medication Sig Dispense Refill  . aspirin 325 MG tablet Take 325 mg by mouth daily.    . busPIRone (BUSPAR) 7.5 MG tablet TAKE ONE  TABLET BY MOUTH TWICE DAILY 60 tablet 3  . magnesium 30 MG tablet Take 30 mg by mouth daily.    Marland Kitchen pyridOXINE (VITAMIN B-6) 25 MG tablet Take 25 mg by mouth daily.    . tamsulosin (FLOMAX) 0.4 MG CAPS capsule TAKE ONE CAPSULE BY MOUTH ONCE DAILY 30 capsule 5  . venlafaxine XR (EFFEXOR-XR) 150 MG 24 hr capsule Take 1 capsule (150 mg total) by mouth daily with breakfast. 90 capsule 1  . zinc gluconate 50 MG tablet Take 50 mg by mouth daily.    Marland Kitchen doxycycline (VIBRA-TABS) 100 MG tablet Take 1 tablet (100 mg total) by mouth 2 (two) times daily. (Patient not taking: Reported on 12/16/2014) 20 tablet 0  . hydrocortisone (PROCTOCORT) 1 % CREA Apply 1 application topically 2 (two) times daily. (Patient not taking: Reported on 12/16/2014) 1 Tube 0  . mupirocin nasal ointment (BACTROBAN) 2 % Place 1 application into the nose 2 (two) times daily. Use one-half of tube in each nostril twice daily for five (5) days. After application, press sides of nose together and gently massage. (Patient not taking: Reported on 12/16/2014) 10 g 0    Results for orders placed or performed  during the hospital encounter of 12/16/14 (from the past 48 hour(s))  CBC with Differential/Platelet     Status: None   Collection Time: 12/16/14  1:59 PM  Result Value Ref Range   WBC 8.5 4.0 - 10.5 K/uL   RBC 4.55 4.22 - 5.81 MIL/uL   Hemoglobin 14.4 13.0 - 17.0 g/dL   HCT 41.2 39.0 - 52.0 %   MCV 90.5 78.0 - 100.0 fL   MCH 31.6 26.0 - 34.0 pg   MCHC 35.0 30.0 - 36.0 g/dL   RDW 13.1 11.5 - 15.5 %   Platelets 216 150 - 400 K/uL   Neutrophils Relative % 68 43 - 77 %   Neutro Abs 5.7 1.7 - 7.7 K/uL   Lymphocytes Relative 18 12 - 46 %   Lymphs Abs 1.6 0.7 - 4.0 K/uL   Monocytes Relative 11 3 - 12 %   Monocytes Absolute 1.0 0.1 - 1.0 K/uL   Eosinophils Relative 3 0 - 5 %   Eosinophils Absolute 0.3 0.0 - 0.7 K/uL   Basophils Relative 0 0 - 1 %   Basophils Absolute 0.0 0.0 - 0.1 K/uL  Comprehensive metabolic panel     Status: Abnormal    Collection Time: 12/16/14  1:59 PM  Result Value Ref Range   Sodium 136 135 - 145 mmol/L   Potassium 4.3 3.5 - 5.1 mmol/L   Chloride 104 101 - 111 mmol/L   CO2 27 22 - 32 mmol/L   Glucose, Bld 97 65 - 99 mg/dL   BUN <5 (L) 6 - 20 mg/dL   Creatinine, Ser 1.15 0.61 - 1.24 mg/dL   Calcium 8.8 (L) 8.9 - 10.3 mg/dL   Total Protein 7.8 6.5 - 8.1 g/dL   Albumin 3.7 3.5 - 5.0 g/dL   AST 35 15 - 41 U/L   ALT 45 17 - 63 U/L   Alkaline Phosphatase 64 38 - 126 U/L   Total Bilirubin 0.4 0.3 - 1.2 mg/dL   GFR calc non Af Amer >60 >60 mL/min   GFR calc Af Amer >60 >60 mL/min    Comment: (NOTE) The eGFR has been calculated using the CKD EPI equation. This calculation has not been validated in all clinical situations. eGFR's persistently <60 mL/min signify possible Chronic Kidney Disease.    Anion gap 5 5 - 15   Ct Elbow Left W/cm  12/16/2014   CLINICAL DATA:  Left arm swelling for 1 week with redness pain around the elbow no trauma, no known insect bite, getting worse with low-grade fever.  EXAM: CT OF THE LEFT ELBOW WITH CONTRAST  TECHNIQUE: Multidetector CT imaging was performed following the standard protocol during bolus administration of intravenous contrast.  CONTRAST:  180mL OMNIPAQUE IOHEXOL 300 MG/ML  SOLN  COMPARISON:  None.  FINDINGS: There is increased attenuation in the subcutaneous dorsal soft tissues extending at least to the level of the mid shaft of the humerus, the cranial most edge of the scan volume, down through the elbow joint and distally into the dorsal aspect of the proximal forearm. There is mild motion artifact, but there is no evidence of periosteal reaction or cortical destruction. No focal osseous abnormalities are identified. There is no elbow joint effusion. There are is an oval ill-defined focus of soft tissue attenuation, heterogeneous, in the subcutaneous soft tissues between the flexor musculature and deep layer of the dermis, measuring 4.4 x 2.1 cm, medial to the distal  humerus from the level of the distal diaphysis to  the medial humeral condyle. This exerts mass effect on surrounding soft tissues. Average attenuation is 60, with a central area of decreased attenuation measuring about 5.  IMPRESSION: Evidence of diffuse cellulitis of the dorsal aspect of the upper extremity from at least the level of the mid humerus extending into the proximal forearm. 4 cm focus of ill-defined heterogeneous soft tissue suggesting phlegmon. Puncture wound or insect bite are possible considerations. No evidence of osseous involvement.   Electronically Signed   By: Skipper Cliche M.D.   On: 12/16/2014 17:40    ROS NO RECENT ILLNESSES OR HOSPITALIZATIONS  Blood pressure 117/74, pulse 95, temperature 99.9 F (37.7 C), temperature source Oral, resp. rate 16, SpO2 97 %. Physical Exam  General Appearance:  Alert, cooperative, no distress, appears stated age  Head:  Normocephalic, without obvious abnormality, atraumatic  Eyes:  Pupils equal, conjunctiva/corneas clear,         Throat: Lips, mucosa, and tongue normal; teeth and gums normal  Neck: No visible masses     Lungs:   respirations unlabored  Chest Wall:  No tenderness or deformity  Heart:  Regular rate and rhythm,  Abdomen:   Soft, non-tender,         Extremities: LEFT ELBOW: MODERATE SWELLING MEDIALLY ABLE TO FLEX AND EXTEND ELBOW, MEDIAL ELBOW WARM TTP  MILD CELLULITIS FLUCTUANT AREA OVER MEDIAL EPICONDYLAR REGION  Pulses: 2+ and symmetric  Skin: Skin color, texture, turgor normal, no rashes or lesions     Neurologic: Normal    Assessment/Plan LEFT ELBOW DEEP SPACE ABSCESS, MEDIALLY  LEFT ELBOW INCISION AND DRAINAGE  R/B/A DISCUSSED WITH PT IN OFFICE.  PT VOICED UNDERSTANDING OF PLAN CONSENT SIGNED DAY OF SURGERY PT SEEN AND EXAMINED PRIOR TO OPERATIVE PROCEDURE/DAY OF SURGERY SITE MARKED. QUESTIONS ANSWERED WILL MAY GO HOME FOLLOWING SURGERY WE ARE PLANNING SURGERY FOR YOUR UPPER EXTREMITY. THE RISKS  AND BENEFITS OF SURGERY INCLUDE BUT NOT LIMITED TO BLEEDING INFECTION, DAMAGE TO NEARBY NERVES ARTERIES TENDONS, FAILURE OF SURGERY TO ACCOMPLISH ITS INTENDED GOALS, PERSISTENT SYMPTOMS AND NEED FOR FURTHER SURGICAL INTERVENTION. WITH THIS IN MIND WE WILL PROCEED. I HAVE DISCUSSED WITH THE PATIENT THE PRE AND POSTOPERATIVE REGIMEN AND THE DOS AND DON'TS. PT VOICED UNDERSTANDING AND INFORMED CONSENT SIGNED.  Linna Hoff 12/16/2014, 6:13 PM

## 2014-12-16 NOTE — Discharge Instructions (Signed)
KEEP BANDAGE CLEAN AND DRY CALL OFFICE FOR F/U APPT [phone] on Friday DO not remove bandage KEEP HAND ELEVATED ABOVE HEART OK TO APPLY ICE TO OPERATIVE AREA CONTACT OFFICE IF ANY WORSENING PAIN OR CONCERNS.

## 2014-12-16 NOTE — ED Provider Notes (Signed)
CSN: [id number]     Arrival date & time 12/16/14  1226 History  This chart was scribed for Eber Hong, MD by Budd Palmer, ED Scribe. This patient was seen in room TR01C/TR01C and the patient's care was started at 1:24 PM.    Chief Complaint  Patient presents with  . Abscess   The history is provided by the patient. No language interpreter was used.   HPI Comments: Stuart Taylor is a 44 y.o. male who presents to the Emergency Department complaining of an abscess to the left arm onset 1 weeks ago.  He notes the abscess began as a small "marble" has grown in size since then. He has applied ice packs with no relief. Pt notes an associated fever, TMax 101. He has ben to his PCP as 9 AM, as well as an orthopedist at 12 who felt it was too close to a nerve bundle to try anything with it. It was recommended he go to the ED. Pt denies any erythema or tick bites anywhere on his body.  Pt denies abdominal pain, HA, bleeding. Pt denies PMHx of HIV, TB, or Syphilis.  Pain is constant, worse with palpation and not associated with IVDU or trauma.    Past Medical History  Diagnosis Date  . History of chicken pox   . Anxiety and depression    Past Surgical History  Procedure Laterality Date  . Eye surgery      x4, [3]Left, [2] Right  . Tonsillectomy     Family History  Problem Relation Age of Onset  . COPD Mother     Living  . Anxiety disorder Mother   . GER disease Mother   . Congestive Heart Failure Mother   . Sleep apnea Mother   . Arthritis Mother   . Kidney disease Mother   . Hypothyroidism Mother   . Hyperlipidemia Mother   . Pulmonary embolism Mother   . Anemia Mother   . Heart attack Father 53    Deceased  . Deep vein thrombosis Father   . Mental illness Sister    History  Substance Use Topics  . Smoking status: Current Every Day Smoker -- 0.50 packs/day for 7 years    Types: Cigarettes  . Smokeless tobacco: Never Used  . Alcohol Use: No    Review of Systems   Constitutional: Positive for fever.  Musculoskeletal: Positive for myalgias.  Skin: Positive for color change.  All other systems reviewed and are negative.     Allergies  Poison oak extract  Home Medications   Prior to Admission medications   Medication Sig Start Date End Date Taking? Authorizing Provider  aspirin 325 MG tablet Take 325 mg by mouth daily.   Yes Historical Provider, MD  busPIRone (BUSPAR) 7.5 MG tablet TAKE ONE TABLET BY MOUTH TWICE DAILY 09/06/14  Yes Waldon Merl, PA-C  magnesium 30 MG tablet Take 30 mg by mouth daily.   Yes Historical Provider, MD  pyridOXINE (VITAMIN B-6) 25 MG tablet Take 25 mg by mouth daily.   Yes Historical Provider, MD  tamsulosin (FLOMAX) 0.4 MG CAPS capsule TAKE ONE CAPSULE BY MOUTH ONCE DAILY 12/14/14  Yes Waldon Merl, PA-C  venlafaxine XR (EFFEXOR-XR) 150 MG 24 hr capsule Take 1 capsule (150 mg total) by mouth daily with breakfast. 07/05/14  Yes Waldon Merl, PA-C  zinc gluconate 50 MG tablet Take 50 mg by mouth daily.   Yes Historical Provider, MD  doxycycline (VIBRA-TABS) 100 MG tablet  Take 1 tablet (100 mg total) by mouth 2 (two) times daily. Patient not taking: Reported on 12/16/2014 12/16/14   Wanda Plump, MD  hydrocortisone (PROCTOCORT) 1 % CREA Apply 1 application topically 2 (two) times daily. Patient not taking: Reported on 12/16/2014 07/05/14   Waldon Merl, PA-C  mupirocin nasal ointment (BACTROBAN) 2 % Place 1 application into the nose 2 (two) times daily. Use one-half of tube in each nostril twice daily for five (5) days. After application, press sides of nose together and gently massage. Patient not taking: Reported on 12/16/2014 06/04/14   Waldon Merl, PA-C   BP 117/74 mmHg  Pulse 95  Temp(Src) 99.9 F (37.7 C) (Oral)  Resp 16  SpO2 97% Physical Exam  Constitutional: He appears well-developed and well-nourished.  HENT:  Head: Normocephalic and atraumatic.  Eyes: Conjunctivae are normal. Right eye exhibits  no discharge. Left eye exhibits no discharge.  Cardiovascular: Normal rate and regular rhythm.   Normal pulses at the wrist on the left. No lymphadedenopathy in the L axilla.  Pulmonary/Chest: Effort normal. No respiratory distress. He has no wheezes. He has no rales.  Abdominal: Soft. There is no tenderness.  Musculoskeletal: Normal range of motion. He exhibits tenderness. He exhibits no edema.  Area of erythema extending from medial upper extremity through the mid forearm ulnar surface. At the [facility], medial arm there is an area 4cm x 8cm erythematous, tender mass.  Neurological: He is alert. Coordination normal.  Normal grip.  Skin: Skin is warm and dry. No rash noted. He is not diaphoretic. There is erythema.  Psychiatric: He has a normal mood and affect.  Nursing note and vitals reviewed.   ED Course  Procedures  DIAGNOSTIC STUDIES: Oxygen Saturation is 96% on RA, adequate by my interpretation.    COORDINATION OF CARE:  Labs Review Labs Reviewed  COMPREHENSIVE METABOLIC PANEL - Abnormal; Notable for the following:    BUN <5 (*)    Calcium 8.8 (*)    All other components within normal limits  CBC WITH DIFFERENTIAL/PLATELET  HIV ANTIBODY (ROUTINE TESTING)    Imaging Review Ct Elbow Left W/cm  12/16/2014   CLINICAL DATA:  Left arm swelling for 1 week with redness pain around the elbow no trauma, no known insect bite, getting worse with low-grade fever.  EXAM: CT OF THE LEFT ELBOW WITH CONTRAST  TECHNIQUE: Multidetector CT imaging was performed following the standard protocol during bolus administration of intravenous contrast.  CONTRAST:  OMNIPAQUE IOHEXOL 300 MG/ML  SOLN  COMPARISON:  None.  FINDINGS: There is increased attenuation in the subcutaneous dorsal soft tissues extending at least to the level of the mid shaft of the humerus, the cranial most edge of the scan volume, down through the elbow joint and distally into the dorsal aspect of the proximal forearm. There is mild  motion artifact, but there is no evidence of periosteal reaction or cortical destruction. No focal osseous abnormalities are identified. There is no elbow joint effusion. There are is an oval ill-defined focus of soft tissue attenuation, heterogeneous, in the subcutaneous soft tissues between the flexor musculature and deep layer of the dermis, measuring 4.4 x 2.1 cm, medial to the distal humerus from the level of the distal diaphysis to the medial humeral condyle. This exerts mass effect on surrounding soft tissues. Average attenuation is 60, with a central area of decreased attenuation measuring about 5.  IMPRESSION: Evidence of diffuse cellulitis of the dorsal aspect of the upper extremity from  at least the level of the mid humerus extending into the proximal forearm. 4 cm focus of ill-defined heterogeneous soft tissue suggesting phlegmon. Puncture wound or insect bite are possible considerations. No evidence of osseous involvement.   Electronically Signed   By: Esperanza Heir M.D.   On: 12/16/2014 17:40      MDM   Final diagnoses:  Swelling  Abscess of left arm   1:29 PM - Discussed plans to consult with ortho and order antibioics. Pt advised of plan for treatment and pt agrees.  1:36 PM Large deep fluid collection on bedside US with presence of cellulitis. With fluid present on [place] - also with cobblestoning  2:44 PM - Consult with Dr Orlan Leavens who will take the pt in the OR. Pt informed of plan and pt agrees.    I personally performed the services described in this documentation, which was scribed in my presence. The recorded information has been reviewed and is accurate.    Meds given in ED:  Medications  lactated ringers infusion (not administered)  vancomycin (VANCOCIN) 500 mg in sodium chloride 0.9 % 100 mL IVPB (0 mg Intravenous Stopped 12/16/14 1530)  vancomycin (VANCOCIN) 500 mg in sodium chloride 0.9 % 100 mL IVPB (0 mg Intravenous Stopped 12/16/14 1718)  iohexol (OMNIPAQUE) 300  MG/ML solution 100 mL (100 mLs Intravenous Contrast Given 12/16/14 1648)    The pt got a Rx for Abx from PMD this AM but has not been able to get it filled as he has been going from office to office to the ED for evaluation.    Eber Hong, MD 12/16/14 Zollie Pee

## 2014-12-16 NOTE — Progress Notes (Signed)
Subjective:    Patient ID: Stuart Taylor, male    DOB: 10/09/1970, 44 y.o.   MRN: [id number]  DOS:  12/16/2014 Type of visit - description : Acute, swelling Interval history: Symptoms started about a week ago with swelling, pain at the left arm. Much worse for the last 24 hours. He also has developed some redness and a low-grade temperature. Not taking any specific meds for his sx   Review of Systems  No chest pain or difficulty breathing No nausea, vomiting, diarrhea. Mild cough with no sputum production Left hand is  slightly numb.  Past Medical History  Diagnosis Date  . History of chicken pox   . Anxiety and depression     Past Surgical History  Procedure Laterality Date  . Eye surgery      x4, [3]Left, [2] Right  . Tonsillectomy      History   Social History  . Marital Status: Single    Spouse Name: N/A  . Number of Children: N/A  . Years of Education: N/A   Occupational History  . Not on file.   Social History Main Topics  . Smoking status: Current Every Day Smoker -- 0.50 packs/day for 7 years    Types: Cigarettes  . Smokeless tobacco: Never Used  . Alcohol Use: No  . Drug Use: No  . Sexual Activity: Not on file   Other Topics Concern  . Not on file   Social History Narrative        Medication List       This list is accurate as of: 12/16/14 12:53 PM.  Always use your most recent med list.               busPIRone 7.5 MG tablet  Commonly known as:  BUSPAR  TAKE ONE TABLET BY MOUTH TWICE DAILY     doxycycline 100 MG tablet  Commonly known as:  VIBRA-TABS  Take 1 tablet (100 mg total) by mouth 2 (two) times daily.     hydrocortisone 1 % Crea  Commonly known as:  PROCTOCORT  Apply 1 application topically 2 (two) times daily.     magnesium 30 MG tablet  Take 30 mg by mouth daily.     mupirocin nasal ointment 2 %  Commonly known as:  BACTROBAN  Place 1 application into the nose 2 (two) times daily. Use one-half of tube in each  nostril twice daily for five (5) days. After application, press sides of nose together and gently massage.     pyridOXINE 25 MG tablet  Commonly known as:  VITAMIN B-6  Take 25 mg by mouth daily.     tamsulosin 0.4 MG Caps capsule  Commonly known as:  FLOMAX  TAKE ONE CAPSULE BY MOUTH ONCE DAILY     venlafaxine XR 150 MG 24 hr capsule  Commonly known as:  EFFEXOR-XR  Take 1 capsule (150 mg total) by mouth daily with breakfast.     zinc gluconate 50 MG tablet  Take 50 mg by mouth daily.           Objective:   Physical Exam  Musculoskeletal:       Arms:  BP 118/76 mmHg  Pulse 78  Temp(Src) 98.1 F (36.7 C) (Oral)  Ht  (1.778 m)  Wt 309 lb 6 oz (140.332 kg)  BMI 44.39 kg/m2  SpO2 97% General:   Well developed, well nourished . NAD.  HEENT:  Normocephalic . Face symmetric, atraumatic  MSK:  See graphic Good radial pulses bilaterally, hands normal to inspection on palpation Neurologic:  alert & oriented X3.  Speech normal, gait appropriate for age and unassisted Psych--  Cognition and judgment appear intact.  Cooperative with normal attention span and concentration.  Behavior appropriate. No anxious or depressed appearing.      Assessment & Plan:   abscess-cellulitis L arm Plan: Doxy Refer to ortho , may need I&D See instructions

## 2014-12-16 NOTE — ED Notes (Signed)
All belongings removed and given to friend.

## 2014-12-16 NOTE — Progress Notes (Signed)
Pre visit review using our clinic review tool, if applicable. No additional management support is needed unless otherwise documented below in the visit note. 

## 2014-12-16 NOTE — Anesthesia Procedure Notes (Signed)
Procedure Name: LMA Insertion Date/Time: 12/16/2014 6:24 PM Performed by: Gavin Pound, Alexes Lamarque J Pre-anesthesia Checklist: Patient identified, Timeout performed, Emergency Drugs available, Suction available and Patient being monitored Patient Re-evaluated:Patient Re-evaluated prior to inductionOxygen Delivery Method: Circle system utilized Preoxygenation: Pre-oxygenation with 100% oxygen Intubation Type: IV induction Ventilation: Mask ventilation without difficulty LMA: LMA inserted LMA Size: 5.0 Number of attempts: 1 Placement Confirmation: positive ETCO2 and breath sounds checked- equal and bilateral Tube secured with: Tape Dental Injury: Teeth and Oropharynx as per pre-operative assessment

## 2014-12-16 NOTE — Transfer of Care (Signed)
Immediate Anesthesia Transfer of Care Note  Patient: Stuart Taylor  Procedure(s) Performed: Procedure(s): IRRIGATION AND DEBRIDEMENT EXTREMITY (Left)  Patient Location: PACU  Anesthesia Type:General  Level of Consciousness: awake  Airway & Oxygen Therapy: Patient Spontanous Breathing and Patient connected to face mask oxygen  Post-op Assessment: Report given to RN and Post -op Vital signs reviewed and stable  Post vital signs: Reviewed and stable  Last Vitals:  Filed Vitals:   12/16/14 1930  BP: 148/99  Pulse: 101  Temp:   Resp: 17    Complications: No apparent anesthesia complications

## 2014-12-16 NOTE — ED Notes (Signed)
Pt in CT at this time.

## 2014-12-16 NOTE — Anesthesia Preprocedure Evaluation (Signed)
Anesthesia Evaluation  Patient identified by MRN, date of birth, ID band Patient awake    Reviewed: Allergy & Precautions, NPO status , Patient's Chart, lab work & pertinent test results  Airway Mallampati: II   Neck ROM: full    Dental   Pulmonary Current Smoker,  breath sounds clear to auscultation        Cardiovascular negative cardio ROS  Rhythm:regular Rate:Normal     Neuro/Psych    GI/Hepatic   Endo/Other  Morbid obesity  Renal/GU      Musculoskeletal   Abdominal   Peds  Hematology   Anesthesia Other Findings   Reproductive/Obstetrics                             Anesthesia Physical Anesthesia Plan  ASA: II  Anesthesia Plan: General   Post-op Pain Management:    Induction: Intravenous  Airway Management Planned: LMA  Additional Equipment:   Intra-op Plan:   Post-operative Plan:   Informed Consent: I have reviewed the patients History and Physical, chart, labs and discussed the procedure including the risks, benefits and alternatives for the proposed anesthesia with the patient or authorized representative who has indicated his/her understanding and acceptance.     Plan Discussed with: CRNA, Anesthesiologist and Surgeon  Anesthesia Plan Comments:         Anesthesia Quick Evaluation

## 2014-12-16 NOTE — Brief Op Note (Signed)
12/16/2014  6:15 PM  PATIENT:  Stuart Taylor  43 y.o. male  PRE-OPERATIVE DIAGNOSIS:  Infected left Arm  POST-OPERATIVE DIAGNOSIS:  * No post-op diagnosis entered *  PROCEDURE:  Procedure(s): IRRIGATION AND DEBRIDEMENT EXTREMITY (Left)  SURGEON:  Surgeon(s) and Role:    * Bradly Bienenstock, MD - Primary  PHYSICIAN ASSISTANT:   ASSISTANTS: none   ANESTHESIA:   general  EBL:     BLOOD ADMINISTERED:none  DRAINS: none   LOCAL MEDICATIONS USED:  NONE  SPECIMEN:  Biopsy / Limited Resection  DISPOSITION OF SPECIMEN:  N/A  COUNTS:  YES  TOURNIQUET:    DICTATION: .161096  PLAN OF CARE: Discharge to home after PACU  PATIENT DISPOSITION:  PACU - hemodynamically stable.   Delay start of Pharmacological VTE agent (>24hrs) due to surgical blood loss or risk of bleeding: yes

## 2014-12-16 NOTE — ED Notes (Signed)
Pt is here with left upper arm abscess that has grown over the last week.

## 2014-12-16 NOTE — Patient Instructions (Signed)
We are referring you to a orthopedic surgeon  Start doxycycline, an antibiotic  Call anytime if you get worse, you have high fevers, chills, the swelling/redness increases or if not improving by the next week.  Tylenol as needed for pain  Arm elevation

## 2014-12-17 ENCOUNTER — Encounter: Payer: Self-pay | Admitting: Physician Assistant

## 2014-12-17 ENCOUNTER — Encounter (HOSPITAL_COMMUNITY): Payer: Self-pay | Admitting: Orthopedic Surgery

## 2014-12-17 LAB — HIV ANTIBODY (ROUTINE TESTING W REFLEX): HIV Screen 4th Generation wRfx: NONREACTIVE

## 2014-12-17 NOTE — Op Note (Signed)
NAME:  Stuart Taylor, Stuart Taylor NO.:  [id number]  MEDICAL RECORD NO.:  [id number]  LOCATION:  [facility]                         FACILITY:  [facility]  PHYSICIAN:  Sharma Covert IV, M.D.DATE OF BIRTH:  10/20/1970  DATE OF PROCEDURE:  12/16/2014 DATE OF DISCHARGE:  12/16/2014                              OPERATIVE REPORT   PREOPERATIVE DIAGNOSIS:  Left arm swelling and pain concerning for a deep space abscess.  POSTOPERATIVE DIAGNOSIS:  Left arm soft tissue mass concerning for granulomatous disease.  ATTENDING PHYSICIAN:  Sharma Covert, M.D., who scrubbed and was present for the entire procedure.  ASSISTANT SURGEON:  None.  ANESTHESIA:  General via LMA.  SURGICAL INDICATIONS:  [name] is a right-hand-dominant gentleman with a worsening pain and redness and swelling.  The patient was evaluated in the emergency department over a week of symptoms and it was recommended that he undergo the above procedure with concern for deep space infection.  Risks, benefits and alternatives were discussed in detail with the patient and signed informed consent was obtained.  Risks include, but not limited to bleeding; infection; damage to nearby nerves, arteries, or tendons; loss of motion of the wrist and digits; incomplete relief of symptoms and need for further surgical intervention.  SURGICAL PROCEDURES: 1. Incision and drainage of left arm. 2. Excision of left arm subfascial mass, greater than 3 cm.  INTRAOPERATIVE SPECIMENS:  Soft tissue mass and granulomatous-appearing tissue to Pathology for culture and pathology.  Aerobic and anaerobic wound cultures.  DESCRIPTION OF PROCEDURE:  The patient was properly identified in the preoperative holding area and marked with a permanent marker made on the left arm to indicate the correct operative site.  The patient was then brought back to the operating room and placed supine on the anesthesia room table where the general anesthetic was  administered.  The patient tolerated this well.  A well-padded tourniquet was placed on the left brachium and sealed with 1000-drape.  Left upper extremity was then prepped and draped in normal sterile fashion.  Time-out was called, the correct side was identified and procedure was then begun.  Attention was then turned to the left elbow where the limb was elevated.  The curvilinear incision was made directly over the abscess, concerning area.  Longitude incision was made.  Dissection was carried down through the skin and subcutaneous tissue, deep to the fascia layer, which was incised longitudinally.  Well-contained granulomatous/phlegmon-appearing tissue was then evacuated.  This was sent for culture as well as tissue analysis.  This was then circumferentially dissected free preserving the subcutaneous veins as well as the distal branch of the medial antebrachial cutaneous nerve.  Following this incision and drainage, there was some mild fluid and this fluid was cultured, but no large purulent accumulation.  Once this was carried out, thorough wound irrigation was then carried out.  This was greater than 3-cm mass and that was removed.  The wound was then thoroughly irrigated.  The skin was then loosely reapproximated with 3-0 Prolene sutures.  Adaptic dressing and sterile compressive bandage were then applied.  The patient was then placed in a well-padded long-arm splint and taken to the recovery room  in good condition.  POSTPROCEDURAL PLAN:  The patient was discharged to home, seen back in the office in approximately 4 days for wound check and splint check back into the splint and culture results.  Go over his tissue culture, so, we will need to always review pathology reports as well as lab reports.     Madelynn Done, M.D.     FWO/MEDQ  D:  12/16/2014  T:  12/17/2014  Job:  [id number]

## 2014-12-18 NOTE — Telephone Encounter (Signed)
Attempted to respond to mychart message from 12/17/14 but pt's mychart account status shows "pending".  Left message for pt to return my call:  From  Juanetta Gosling   To  Waldon Merl, PA-C   Sent  12/17/2014 8:03 PM      How can I tell my blood type looking at my chart?

## 2014-12-20 LAB — ANAEROBIC CULTURE: Gram Stain: NONE SEEN

## 2014-12-20 LAB — TISSUE CULTURE
Culture: NO GROWTH
Gram Stain: NONE SEEN

## 2014-12-20 NOTE — Anesthesia Postprocedure Evaluation (Signed)
Anesthesia Post Note  Patient: Stuart Taylor  Procedure(s) Performed: Procedure(s) (LRB): IRRIGATION AND DEBRIDEMENT EXTREMITY (Left)  Anesthesia type: General  Patient location: PACU  Post pain: Pain level controlled and Adequate analgesia  Post assessment: Post-op Vital signs reviewed, Patient's Cardiovascular Status Stable, Respiratory Function Stable, Patent Airway and Pain level controlled  Last Vitals:  Filed Vitals:   12/16/14 2015  BP:   Pulse:   Temp: 37.3 C  Resp:     Post vital signs: Reviewed and stable  Level of consciousness: awake, alert  and oriented  Complications: No apparent anesthesia complications

## 2014-12-24 ENCOUNTER — Encounter: Payer: Self-pay | Admitting: *Deleted

## 2015-01-11 ENCOUNTER — Other Ambulatory Visit: Payer: Self-pay | Admitting: Physician Assistant

## 2015-01-14 ENCOUNTER — Telehealth: Payer: Self-pay | Admitting: Physician Assistant

## 2015-01-14 MED ORDER — NICOTINE 21 MG/24HR TD PT24: 21.0000 mg | MEDICATED_PATCH | Freq: Every day | TRANSDERMAL | Status: DC

## 2015-01-14 NOTE — Telephone Encounter (Signed)
Pleas esend Nicoderm Patch Rx to [place], pt has met OOP/SLS

## 2015-01-14 NOTE — Telephone Encounter (Signed)
Med sent in.

## 2015-01-14 NOTE — Telephone Encounter (Signed)
Would recommend Rx Nicoderm patches if he has not tried. Otherwise would recommend Wellbutrin. Chantix is another option but can cause agitation, strange dreams and worsening mood in people with history of anxiety and depression. Let me know what he would like to try -- would recommend Nicoderm first.

## 2015-01-14 NOTE — Telephone Encounter (Signed)
Caller name: Rajesh Wyss  Relationship to patient:Self  Can be reached: [phone] (cell) or [phone] (work)  Pharmacy:  Reason for call: pt says that he would like to stop smoking and want to know if he can be prescribed something to help. Please call back to advise.

## 2015-01-15 MED ORDER — BUPROPION HCL ER (SR) 150 MG PO TB12: 150.0000 mg | ORAL_TABLET | Freq: Two times a day (BID) | ORAL | Status: DC

## 2015-01-15 NOTE — Telephone Encounter (Signed)
Relation to pt: self Call back number: mobile [phone] or [phone] (w) Pharmacy: [facility] - [place], [place] - [address] [phone] (Phone) [phone] (Fax)         Reason for call:  Patient states insurance will not cover patches requesting Wellbutrin.

## 2015-01-15 NOTE — Addendum Note (Signed)
Addended by: Marcelline Mates on: 01/15/2015 04:20 PM   Modules accepted: Orders

## 2015-01-15 NOTE — Telephone Encounter (Signed)
Medication sent to pharmacy. Take as directed. Follow-up 1 month.

## 2015-01-17 NOTE — Telephone Encounter (Signed)
Patient scheduled appointment for 02/18/2015

## 2015-02-10 ENCOUNTER — Ambulatory Visit: Payer: 59 | Admitting: Physician Assistant

## 2015-02-12 ENCOUNTER — Other Ambulatory Visit: Payer: Self-pay | Admitting: Physician Assistant

## 2015-02-14 ENCOUNTER — Telehealth: Payer: Self-pay | Admitting: Physician Assistant

## 2015-02-14 MED ORDER — VENLAFAXINE HCL ER 150 MG PO CP24
150.0000 mg | ORAL_CAPSULE | Freq: Every day | ORAL | Status: DC
Start: 2015-02-14 — End: 2015-02-18

## 2015-02-14 NOTE — Telephone Encounter (Signed)
Pt said that [facility] told him we denied RX for Effexor. I do not see it was requested. Pt has been out for 2 days and states he is having panic attacks. Pt has appt 10/4 with [facility]. He said if he could get enough til his appt that would be fine. Please call to let him know either way. Best # [phone] and ok to leave a msg.

## 2015-02-14 NOTE — Telephone Encounter (Signed)
Verified patient has appointment on 02/18/15.  Rx sent to [facility] pharmacy for 15 with 0 rf due to note on Rx stating "patient needs appointment before any further refills."  Patient notified of medication via voicemail.

## 2015-02-18 ENCOUNTER — Ambulatory Visit (INDEPENDENT_AMBULATORY_CARE_PROVIDER_SITE_OTHER): Payer: 59 | Admitting: Physician Assistant

## 2015-02-18 ENCOUNTER — Encounter: Payer: Self-pay | Admitting: Physician Assistant

## 2015-02-18 VITALS — BP 126/88 | HR 87 | Temp 97.9°F | Resp 16 | Ht 70.0 in | Wt 312.4 lb

## 2015-02-18 DIAGNOSIS — Z23 Encounter for immunization: Secondary | ICD-10-CM

## 2015-02-18 DIAGNOSIS — F32A Depression, unspecified: Secondary | ICD-10-CM

## 2015-02-18 DIAGNOSIS — N401 Enlarged prostate with lower urinary tract symptoms: Secondary | ICD-10-CM

## 2015-02-18 DIAGNOSIS — F329 Major depressive disorder, single episode, unspecified: Secondary | ICD-10-CM

## 2015-02-18 DIAGNOSIS — F419 Anxiety disorder, unspecified: Secondary | ICD-10-CM

## 2015-02-18 DIAGNOSIS — F418 Other specified anxiety disorders: Secondary | ICD-10-CM | POA: Diagnosis not present

## 2015-02-18 DIAGNOSIS — R3915 Urgency of urination: Secondary | ICD-10-CM | POA: Diagnosis not present

## 2015-02-18 MED ORDER — TAMSULOSIN HCL 0.4 MG PO CAPS: 0.4000 mg | ORAL_CAPSULE | Freq: Every day | ORAL | Status: DC

## 2015-02-18 MED ORDER — VENLAFAXINE HCL ER 150 MG PO CP24: 150.0000 mg | ORAL_CAPSULE | Freq: Every day | ORAL | Status: DC

## 2015-02-18 MED ORDER — BUSPIRONE HCL 7.5 MG PO TABS: 7.5000 mg | ORAL_TABLET | Freq: Two times a day (BID) | ORAL | Status: DC

## 2015-02-18 MED ORDER — BUPROPION HCL ER (SR) 150 MG PO TB12: 150.0000 mg | ORAL_TABLET | Freq: Two times a day (BID) | ORAL | Status: DC

## 2015-02-18 NOTE — Progress Notes (Signed)
Pre visit review using our clinic review tool, if applicable. No additional management support is needed unless otherwise documented below in the visit note/SLS  

## 2015-02-18 NOTE — Assessment & Plan Note (Signed)
Stable. Doing very well for extended period of time. Medications refilled.

## 2015-02-18 NOTE — Progress Notes (Signed)
Patient presents to clinic today for 76-monthfollow-up of anxiety and BPH  Anxiety --  Is currently on combination of Effexor and BuSpar, with Wellbutrin added to help with mood and smoking. Is taking daily as directed. Endorses good mood overall. Denies panic attack. Denies SI/HI. Smoking has cut down from 1 ppd to .25 pack on some days.  BPH -- Previously well controlled on Flomax nightly. Nocturia decreased from 6-7 to 1-2. Is pleased with results. Denies hematuria, urinary urgency, frequency or dysuria.   Past Medical History  Diagnosis Date  . History of chicken pox   . Anxiety and depression     Current Outpatient Prescriptions on File Prior to Visit  Medication Sig Dispense Refill  . aspirin 325 MG tablet Take 325 mg by mouth daily.    . magnesium 30 MG tablet Take 30 mg by mouth daily.    .Marland KitchenpyridOXINE (VITAMIN B-6) 25 MG tablet Take 25 mg by mouth daily.    .Marland Kitchenzinc gluconate 50 MG tablet Take 50 mg by mouth daily.     No current facility-administered medications on file prior to visit.    Allergies  Allergen Reactions  . Poison Oak Extract [Extract Of Poison Oak] Anaphylaxis, Swelling and Rash    Family History  Problem Relation Age of Onset  . COPD Mother     Living  . Anxiety disorder Mother   . GER disease Mother   . Congestive Heart Failure Mother   . Sleep apnea Mother   . Arthritis Mother   . Kidney disease Mother   . Hypothyroidism Mother   . Hyperlipidemia Mother   . Pulmonary embolism Mother   . Anemia Mother   . Heart attack Father 521   Deceased  . Deep vein thrombosis Father   . Mental illness Sister     Social History   Social History  . Marital Status: Single    Spouse Name: N/A  . Number of Children: N/A  . Years of Education: N/A   Social History Main Topics  . Smoking status: Current Every Day Smoker -- 0.50 packs/day for 7 years    Types: Cigarettes  . Smokeless tobacco: Never Used  . Alcohol Use: No  . Drug Use: No  . Sexual  Activity: Not Asked   Other Topics Concern  . None   Social History Narrative   Review of Systems - See HPI.  All other ROS are negative.  BP 126/88 mmHg  Pulse 87  Temp(Src) 97.9 F (36.6 C) (Oral)  Resp 16  Ht _0  (1.778 m)  Wt 312 lb 6 oz (141.692 kg)  BMI 44.82 kg/m2  SpO2 98%  Physical Exam  Constitutional: He is oriented to person, place, and time and well-developed, well-nourished, and in no distress.  HENT:  Head: Normocephalic and atraumatic.  Eyes: Conjunctivae are normal.  Cardiovascular: Normal rate, regular rhythm, normal heart sounds and intact distal pulses.   Pulmonary/Chest: Effort normal and breath sounds normal. No respiratory distress. He has no wheezes. He has no rales. He exhibits no tenderness.  Neurological: He is alert and oriented to person, place, and time.  Skin: Skin is warm and dry. No rash noted.  Psychiatric: Affect normal.  Vitals reviewed.  Recent Results (from the past 2160 hour(s))  CBC with Differential/Platelet     Status: None   Collection Time: 12/16/14  1:59 PM  Result Value Ref Range   WBC 8.5 4.0 - 10.5 K/uL   RBC 4.55  4.22 - 5.81 MIL/uL   Hemoglobin 14.4 13.0 - 17.0 g/dL   HCT 41.2 39.0 - 52.0 %   MCV 90.5 78.0 - 100.0 fL   MCH 31.6 26.0 - 34.0 pg   MCHC 35.0 30.0 - 36.0 g/dL   RDW 13.1 11.5 - 15.5 %   Platelets 216 150 - 400 K/uL   Neutrophils Relative % 68 43 - 77 %   Neutro Abs 5.7 1.7 - 7.7 K/uL   Lymphocytes Relative 18 12 - 46 %   Lymphs Abs 1.6 0.7 - 4.0 K/uL   Monocytes Relative 11 3 - 12 %   Monocytes Absolute 1.0 0.1 - 1.0 K/uL   Eosinophils Relative 3 0 - 5 %   Eosinophils Absolute 0.3 0.0 - 0.7 K/uL   Basophils Relative 0 0 - 1 %   Basophils Absolute 0.0 0.0 - 0.1 K/uL  Comprehensive metabolic panel     Status: Abnormal   Collection Time: 12/16/14  1:59 PM  Result Value Ref Range   Sodium 136 135 - 145 mmol/L   Potassium 4.3 3.5 - 5.1 mmol/L   Chloride 104 101 - 111 mmol/L   CO2 27 22 - 32 mmol/L    Glucose, Bld 97 65 - 99 mg/dL   BUN <5 (L) 6 - 20 mg/dL   Creatinine, Ser 1.15 0.61 - 1.24 mg/dL   Calcium 8.8 (L) 8.9 - 10.3 mg/dL   Total Protein 7.8 6.5 - 8.1 g/dL   Albumin 3.7 3.5 - 5.0 g/dL   AST 35 15 - 41 U/L   ALT 45 17 - 63 U/L   Alkaline Phosphatase 64 38 - 126 U/L   Total Bilirubin 0.4 0.3 - 1.2 mg/dL   GFR calc non Af Amer >60 >60 mL/min   GFR calc Af Amer >60 >60 mL/min    Comment: (NOTE) The eGFR has been calculated using the CKD EPI equation. This calculation has not been validated in all clinical situations. eGFR's persistently <60 mL/min signify possible Chronic Kidney Disease.    Anion gap 5 5 - 15  HIV antibody     Status: None   Collection Time: 12/16/14  3:38 PM  Result Value Ref Range   HIV Screen 4th Generation wRfx Non Reactive Non Reactive    Comment: (NOTE) Performed At: [facility] [place], [place] [id number] Lindon Romp MD [id number]   Anaerobic culture     Status: None   Collection Time: 12/16/14  6:43 PM  Result Value Ref Range   Specimen Description WOUND LEFT ARM    Special Requests NONE    Gram Stain      NO WBC SEEN NO SQUAMOUS EPITHELIAL CELLS SEEN NO ORGANISMS SEEN Performed at [facility]    Culture      NO ANAEROBES ISOLATED Performed at [facility]    Report Status 12/20/2014 FINAL   Tissue culture     Status: None   Collection Time: 12/16/14  6:49 PM  Result Value Ref Range   Specimen Description TISSUE LEFT ARM    Special Requests NONE    Gram Stain      NO WBC SEEN NO SQUAMOUS EPITHELIAL CELLS SEEN NO ORGANISMS SEEN Performed at [facility]    Culture      NO GROWTH 3 DAYS Performed at [facility]    Report Status 12/20/2014 FINAL     Assessment/Plan: Benign prostatic hyperplasia (BPH) with urinary urgency Doing very well. Will continue  current dose of Flomax. Will check PSA and DRE at yearly exams.  Anxiety and depression Stable. Doing very  well for extended period of time. Medications refilled.

## 2015-02-18 NOTE — Patient Instructions (Signed)
Please continue medications as directed. Remember the eventual goal is to completely stop smoking.  Follow-up with me yearly when due for your complete physical. Come see me any time if you are sick or have concerns.

## 2015-02-18 NOTE — Assessment & Plan Note (Signed)
Doing very well. Will continue current dose of Flomax. Will check PSA and DRE at yearly exams.

## 2015-02-19 DIAGNOSIS — Z23 Encounter for immunization: Secondary | ICD-10-CM

## 2016-05-27 ENCOUNTER — Telehealth: Payer: Self-pay | Admitting: Physician Assistant

## 2016-05-27 NOTE — Telephone Encounter (Signed)
OK 

## 2016-05-27 NOTE — Telephone Encounter (Signed)
Ok with me 

## 2016-05-27 NOTE — Telephone Encounter (Signed)
[facility] [place].      CB: [phone]

## 2016-05-31 NOTE — Telephone Encounter (Signed)
Patient scheduled appointment for Thursday  06/03/17 with Dr. Carmelia RollerWendling

## 2016-06-03 ENCOUNTER — Ambulatory Visit: Payer: Self-pay | Admitting: Family Medicine

## 2016-06-08 ENCOUNTER — Telehealth: Payer: Self-pay

## 2016-06-08 NOTE — Telephone Encounter (Signed)
Pre visit call made to patient. Message left for cal back.

## 2016-06-09 ENCOUNTER — Ambulatory Visit: Payer: Self-pay | Admitting: Family Medicine

## 2016-06-10 ENCOUNTER — Encounter: Payer: Self-pay | Admitting: Family Medicine

## 2016-06-10 ENCOUNTER — Ambulatory Visit (INDEPENDENT_AMBULATORY_CARE_PROVIDER_SITE_OTHER): Payer: BLUE CROSS/BLUE SHIELD | Admitting: Family Medicine

## 2016-06-10 VITALS — BP 132/84 | HR 102 | Temp 98.1°F | Ht 70.75 in | Wt 350.2 lb

## 2016-06-10 DIAGNOSIS — F329 Major depressive disorder, single episode, unspecified: Secondary | ICD-10-CM

## 2016-06-10 DIAGNOSIS — Z23 Encounter for immunization: Secondary | ICD-10-CM

## 2016-06-10 DIAGNOSIS — R0683 Snoring: Secondary | ICD-10-CM

## 2016-06-10 DIAGNOSIS — Z13220 Encounter for screening for lipoid disorders: Secondary | ICD-10-CM | POA: Diagnosis not present

## 2016-06-10 DIAGNOSIS — R3915 Urgency of urination: Secondary | ICD-10-CM

## 2016-06-10 DIAGNOSIS — N401 Enlarged prostate with lower urinary tract symptoms: Secondary | ICD-10-CM | POA: Diagnosis not present

## 2016-06-10 DIAGNOSIS — F418 Other specified anxiety disorders: Secondary | ICD-10-CM

## 2016-06-10 DIAGNOSIS — F32A Depression, unspecified: Secondary | ICD-10-CM

## 2016-06-10 DIAGNOSIS — F419 Anxiety disorder, unspecified: Principal | ICD-10-CM

## 2016-06-10 LAB — COMPREHENSIVE METABOLIC PANEL
ALBUMIN: 4.2 g/dL (ref 3.5–5.2)
ALK PHOS: 70 U/L (ref 39–117)
ALT: 53 U/L (ref 0–53)
AST: 41 U/L — ABNORMAL HIGH (ref 0–37)
BUN: 9 mg/dL (ref 6–23)
CHLORIDE: 106 meq/L (ref 96–112)
CO2: 25 mEq/L (ref 19–32)
Calcium: 9.1 mg/dL (ref 8.4–10.5)
Creatinine, Ser: 0.91 mg/dL (ref 0.40–1.50)
GFR: 96.01 mL/min (ref >60.00)
GLUCOSE: 105 mg/dL — AB (ref 70–99)
POTASSIUM: 4.2 meq/L (ref 3.5–5.1)
Sodium: 138 mEq/L (ref 135–145)
TOTAL PROTEIN: 7.2 g/dL (ref 6.0–8.3)
Total Bilirubin: 0.4 mg/dL (ref 0.2–1.2)

## 2016-06-10 LAB — LIPID PANEL
CHOL/HDL RATIO: 8
CHOLESTEROL: 210 mg/dL — AB (ref 0–200)
HDL: 26.6 mg/dL — AB (ref >39.00)
Triglycerides: 445 mg/dL — ABNORMAL HIGH (ref 0.0–149.0)

## 2016-06-10 LAB — HEMOGLOBIN A1C: Hgb A1c MFr Bld: 5 % (ref 4.6–6.5)

## 2016-06-10 LAB — LDL CHOLESTEROL, DIRECT: LDL DIRECT: 107 mg/dL

## 2016-06-10 MED ORDER — VENLAFAXINE HCL ER 75 MG PO CP24: 75.0000 mg | ORAL_CAPSULE | Freq: Every day | ORAL | 2 refills | Status: DC

## 2016-06-10 MED ORDER — TAMSULOSIN HCL 0.4 MG PO CAPS: 0.4000 mg | ORAL_CAPSULE | Freq: Every day | ORAL | 5 refills | Status: DC

## 2016-06-10 NOTE — Patient Instructions (Signed)
Keep up the good work with going to the gym.  Healthy Eating Plan Many factors influence your heart health, including eating and exercise habits. Heart (coronary) risk increases with abnormal blood fat (lipid) levels. Heart-healthy meal planning includes limiting unhealthy fats, increasing healthy fats, and making other small dietary changes. This includes maintaining a healthy body weight to help keep lipid levels within a normal range.  WHAT IS MY PLAN?  Your health care provider recommends that you:  Drink a glass of water before meals to help with satiety.  Eat slowly.  An alternative to the water is to add Metamucil. This will help with satiety as well. It does contain calories, unlike water.  WHAT TYPES OF FAT SHOULD I CHOOSE?  Choose healthy fats more often. Choose monounsaturated and polyunsaturated fats, such as olive oil and canola oil, flaxseeds, walnuts, almonds, and seeds.  Eat more omega-3 fats. Good choices include salmon, mackerel, sardines, tuna, flaxseed oil, and ground flaxseeds. Aim to eat fish at least two times each week.  Avoid foods with partially hydrogenated oils in them. These contain trans fats. Examples of foods that contain trans fats are stick margarine, some tub margarines, cookies, crackers, and other baked goods. If you are going to avoid a fat, this is the one to avoid!  WHAT GENERAL GUIDELINES DO I NEED TO FOLLOW?  Check food labels carefully to identify foods with trans fats. Avoid these types of options when possible.  Fill one half of your plate with vegetables and green salads. Eat 4-5 servings of vegetables per day. A serving of vegetables equals 1 cup of raw leafy vegetables,  cup of raw or cooked cut-up vegetables, or  cup of vegetable juice.  Fill one fourth of your plate with whole grains. Look for the word "whole" as the first word in the ingredient list.  Fill one fourth of your plate with lean protein foods.  Eat 4-5 servings of fruit  per day. A serving of fruit equals one medium whole fruit,  cup of dried fruit,  cup of fresh, frozen, or canned fruit. Try to avoid fruits in cups/syrups as the sugar content can be high.  Eat more foods that contain soluble fiber. Examples of foods that contain this type of fiber are apples, broccoli, carrots, beans, peas, and barley. Aim to get 20-30 g of fiber per day.  Eat more home-cooked food and less restaurant, buffet, and fast food.  Limit or avoid alcohol.  Limit foods that are high in starch and sugar.  Avoid fried foods when able.  Cook foods by using methods other than frying. Baking, boiling, grilling, and broiling are all great options. Other fat-reducing suggestions include: ? Removing the skin from poultry. ? Removing all visible fats from meats. ? Skimming the fat off of stews, soups, and gravies before serving them. ? Steaming vegetables in water or broth.  Lose weight if you are overweight. Losing just 5-10% of your initial body weight can help your overall health and prevent diseases such as diabetes and heart disease.  Increase your consumption of nuts, legumes, and seeds to 4-5 servings per week. One serving of dried beans or legumes equals  cup after being cooked, one serving of nuts equals 1 ounces, and one serving of seeds equals  ounce or 1 tablespoon.  WHAT ARE GOOD FOODS CAN I EAT? Grains Grainy breads (try to find bread that is 3 g of fiber per slice or greater), oatmeal, light popcorn. Whole-grain cereals. Rice and pasta,  including brown rice and those that are made with whole wheat. Edamame pasta is a great alternative to grain pasta. It has a higher protein content. Try to avoid significant consumption of white bread, sugary cereals, or pastries/baked goods.  Vegetables All vegetables. Cooked white potatoes do not count as vegetables.  Fruits All fruits, but limit pineapple and bananas as these fruits have a higher sugar content.  Meats and Other  Protein Sources Lean, well-trimmed beef, veal, pork, and lamb. Chicken and [place] without skin. All fish and shellfish. Wild duck, rabbit, pheasant, and venison. Egg whites or low-cholesterol egg substitutes. Dried beans, peas, lentils, and tofu.Seeds and most nuts.  Dairy Low-fat or nonfat cheeses, including ricotta, string, and mozzarella. Skim or 1% milk that is liquid, powdered, or evaporated. Buttermilk that is made with low-fat milk. Nonfat or low-fat yogurt. Soy/Almond milk are good alternatives if you cannot handle dairy.  Beverages Water is the best for you. Sports drinks with less sugar are more desirable unless you are a highly active athlete.  Sweets and Desserts Sherbets and fruit ices. Honey, jam, marmalade, jelly, and syrups. Dark chocolate.  Eat all sweets and desserts in moderation.  Fats and Oils Nonhydrogenated (trans-free) margarines. Vegetable oils, including soybean, sesame, sunflower, olive, peanut, safflower, corn, canola, and cottonseed. Salad dressings or mayonnaise that are made with a vegetable oil. Limit added fats and oils that you use for cooking, baking, salads, and as spreads.  Other Cocoa powder. Coffee and tea. Most condiments.  The items listed above may not be a complete list of recommended foods or beverages. Contact your dietitian for more options.

## 2016-06-10 NOTE — Progress Notes (Signed)
Chief Complaint  Patient presents with  . Establish Care    Pt here to est care. Former pt of [employer]. Due to not having insurance previously pt states that he has not been taking his meds the way he should. Would like flu vaccine done today.    Subjective: Patient is a 46 y.o. male here for medication review.  Anxiety with depression The patient has a history of anxiety with depression previously on Wellbutrin, BuSpar, and Effexor. He felt the Effexor was very helpful, however did not notice much of a difference on Wellbutrin and BuSpar. He's been without insurance for over a year, and thus has not been taking his medications in the same amount of time. He notices that he is much more irritable and agitated. He has some anhedonia and depression of thoughts. No change in appetite, sleep, guilt, concentration, or weight changes. No thoughts of harming self or others. No self-medication.  BPH The patient also has a history of BPH. He was on Flomax for this. Every since going off of this medication, he has been having increased urinary frequency. There is been no pain or blood in urine. He would like to go back on this medication.  Snoring The patient also noticed that he is not have restful sleep. He states that he can fall asleep at any given time. He does snore at night. Unsure if anybody has noticed that he stops breathing at night.  ROS: GU: +frequency Psych: No SI or HI  Family History  Problem Relation Age of Onset  . COPD Mother     Living  . Anxiety disorder Mother   . GER disease Mother   . Congestive Heart Failure Mother   . Sleep apnea Mother   . Arthritis Mother   . Kidney disease Mother   . Hypothyroidism Mother   . Hyperlipidemia Mother   . Pulmonary embolism Mother   . Anemia Mother   . Heart attack Father 49    Deceased  . Deep vein thrombosis Father   . Cancer Father 11    Ureteral  . Mental illness Sister    Past Medical History:  Diagnosis Date  .  Anxiety and depression   . History of chicken pox    Allergies  Allergen Reactions  . Poison Oak Extract [Poison Oak Extract] Anaphylaxis, Swelling and Rash    Current Outpatient Prescriptions:  .  pyridOXINE (VITAMIN B-6) 25 MG tablet, Take 25 mg by mouth daily., Disp: , Rfl:  .  tamsulosin (FLOMAX) 0.4 MG CAPS capsule, Take 1 capsule (0.4 mg total) by mouth daily., Disp: 30 capsule, Rfl: 5 .  venlafaxine XR (EFFEXOR-XR) 75 MG 24 hr capsule, Take 1 capsule (75 mg total) by mouth daily with breakfast., Disp: 30 capsule, Rfl: 2  Objective: BP 132/84   Pulse (!) 102   Temp 98.1 F (36.7 C) (Oral)   Ht 5' 10.75" (1.797 m)   Wt (!) 350 lb 3.2 oz (158.8 kg)   SpO2 97%   BMI 49.19 kg/m  General: Awake, appears stated age HEENT: MMM, Mallampati 4, EOM, nares patent wo discharge, ears neg b/l Heart: RRR, no murmurs, no bruits Lungs: CTAB, no rales, wheezes or rhonchi. No accessory muscle use Psych: Age appropriate judgment and insight, normal affect and mood  Assessment and Plan: Anxiety and depression - Plan: venlafaxine XR (EFFEXOR-XR) 75 MG 24 hr capsule  Need for influenza vaccination - Plan: Flu Vaccine QUAD 36+ mos PF IM (Fluarix & Fluzone [employer]  PF)  Benign prostatic hyperplasia (BPH) with urinary urgency - Plan: tamsulosin (FLOMAX) 0.4 MG CAPS capsule  Snoring - Plan: Ambulatory referral to Pulmonology  Obesity, morbid (HCC) - Plan: Hemoglobin A1c, Comprehensive metabolic panel  Screening cholesterol level - Plan: Lipid panel  Orders as above. Okay to stop Wellbutrin and BuSpar as he did not notice a significant effect and has not been on them in over one year. Follow-up in 6 weeks to recheck mood. I'm starting him on the lower dose to offset possible side effects.  He likely has sleep apnea given his symptoms and body habitus.  Counseled on diet and exercise. He has joined a gym. I will see him sooner pending the above lab workup. The patient voiced understanding and  agreement to the plan.  Jilda Rocheicholas Paul [place] Vista[place], DO 06/10/16  8:53 AM

## 2016-06-10 NOTE — Progress Notes (Signed)
Pre visit review using our clinic review tool, if applicable. No additional management support is needed unless otherwise documented below in the visit note. 

## 2016-06-21 ENCOUNTER — Encounter: Payer: Self-pay | Admitting: Family Medicine

## 2016-06-29 ENCOUNTER — Encounter: Payer: Self-pay | Admitting: *Deleted

## 2016-07-22 ENCOUNTER — Ambulatory Visit (INDEPENDENT_AMBULATORY_CARE_PROVIDER_SITE_OTHER): Payer: BLUE CROSS/BLUE SHIELD | Admitting: Family Medicine

## 2016-07-22 ENCOUNTER — Encounter: Payer: Self-pay | Admitting: Family Medicine

## 2016-07-22 VITALS — BP 118/76 | HR 74 | Temp 98.0°F | Ht 70.75 in | Wt 318.6 lb

## 2016-07-22 DIAGNOSIS — F418 Other specified anxiety disorders: Secondary | ICD-10-CM

## 2016-07-22 DIAGNOSIS — R740 Nonspecific elevation of levels of transaminase and lactic acid dehydrogenase [LDH]: Secondary | ICD-10-CM | POA: Diagnosis not present

## 2016-07-22 DIAGNOSIS — F32A Depression, unspecified: Secondary | ICD-10-CM

## 2016-07-22 DIAGNOSIS — F329 Major depressive disorder, single episode, unspecified: Secondary | ICD-10-CM

## 2016-07-22 DIAGNOSIS — E781 Pure hyperglyceridemia: Secondary | ICD-10-CM

## 2016-07-22 DIAGNOSIS — F419 Anxiety disorder, unspecified: Principal | ICD-10-CM

## 2016-07-22 DIAGNOSIS — R7401 Elevation of levels of liver transaminase levels: Secondary | ICD-10-CM

## 2016-07-22 DIAGNOSIS — Z23 Encounter for immunization: Secondary | ICD-10-CM | POA: Diagnosis not present

## 2016-07-22 LAB — LIPID PANEL
CHOL/HDL RATIO: 7
Cholesterol: 193 mg/dL (ref 0–200)
HDL: 27.6 mg/dL — ABNORMAL LOW (ref >39.00)
LDL CALC: 128 mg/dL — AB (ref 0–99)
NONHDL: 165.57
TRIGLYCERIDES: 186 mg/dL — AB (ref 0.0–149.0)
VLDL: 37.2 mg/dL (ref 0.0–40.0)

## 2016-07-22 LAB — IBC PANEL
IRON: 87 ug/dL (ref 42–165)
Saturation Ratios: 24.3 % (ref 20.0–50.0)
TRANSFERRIN: 256 mg/dL (ref 212.0–360.0)

## 2016-07-22 LAB — HEPATITIS C ANTIBODY: HCV Ab: NEGATIVE

## 2016-07-22 LAB — HEPATITIS B SURFACE ANTIGEN: HEP B S AG: NEGATIVE

## 2016-07-22 LAB — FERRITIN: FERRITIN: 119.3 ng/mL (ref 22.0–322.0)

## 2016-07-22 MED ORDER — VENLAFAXINE HCL ER 75 MG PO CP24: 75.0000 mg | ORAL_CAPSULE | Freq: Every day | ORAL | 5 refills | Status: DC

## 2016-07-22 NOTE — Patient Instructions (Addendum)
Keep on the 75 mg dose. If anything changes, move up your appointment and start taking 2 tabs daily.  We will reach out to you if I would like to see you sooner based on your lab results.   Excellent job with your weight loss!

## 2016-07-22 NOTE — Progress Notes (Signed)
Pre visit review using our clinic review tool, if applicable. No additional management support is needed unless otherwise documented below in the visit note. 

## 2016-07-22 NOTE — Addendum Note (Signed)
Addended by: Verdie ShireBAYNES, Fleming Prill M on: 07/22/2016 08:42 AM   Modules accepted: Orders

## 2016-07-22 NOTE — Progress Notes (Signed)
Chief Complaint  Patient presents with  . Follow-up    6 weeks-on anxiety and depression-pt states going good-discuss recent lab results  . Dizziness    pt states it happens after he has been sitting for awhile-(comes and goes) x 1 year    Subjective Stuart Taylor presents for f/u anxiety/depression.  Seen 6 weeks ago and restarted on Effexor 75 mg XR daily. Tolerating medicine well with no side effects. Feels he is doing much better. No thoughts of harming self or others. No self-medication with alcohol, prescription drugs or illicit drugs.  Hypertriglyceridemia Pt has TG's in 400's last visit, has lost around 30 lbs since then by cutting down on the simple sugars in his diet. Feels much better. Will be rechecked today.  ROS Psych: No homicidal or suicidal thoughts  Past Medical History:  Diagnosis Date  . Anxiety and depression   . History of chicken pox    Family History  Problem Relation Age of Onset  . COPD Mother     Living  . Anxiety disorder Mother   . GER disease Mother   . Congestive Heart Failure Mother   . Sleep apnea Mother   . Arthritis Mother   . Kidney disease Mother   . Hypothyroidism Mother   . Hyperlipidemia Mother   . Pulmonary embolism Mother   . Anemia Mother   . Heart attack Father [id number]    Deceased  . Deep vein thrombosis Father   . Cancer Father [id number]    Ureteral  . Mental illness Sister    Allergies as of 07/22/2016      Reactions   Poison Oak Extract [poison Oak Extract] Anaphylaxis, Swelling, Rash      Medication List       Accurate as of 07/22/16  7:45 AM. Always use your most recent med list.          pyridOXINE 25 MG tablet Commonly known as:  VITAMIN B-6 Take 25 mg by mouth daily.   tamsulosin 0.4 MG Caps capsule Commonly known as:  FLOMAX Take 1 capsule (0.4 mg total) by mouth daily.   venlafaxine XR 75 MG 24 hr capsule Commonly known as:  EFFEXOR-XR Take 1 capsule (75 mg total) by mouth daily with breakfast.        Exam BP 118/76 (BP Location: Left Arm)   Pulse 74   Temp 98 F (36.7 C) (Oral)   Ht 5' 10.75" (1.797 m)   Wt (!) 318 lb 9.6 oz (144.5 kg)   SpO2 96%   BMI 44.75 kg/m  General:  well developed, well nourished, in no apparent distress Neck: neck supple without adenopathy, thyromegaly, or masses Lungs:  clear to auscultation, breath sounds equal bilaterally, no respiratory distress Cardio:  regular rate and rhythm without murmurs, heart sounds without clicks or rubs Psych: well oriented with normal range of affect and age-appropriate judgement/insight, alert and oriented x4.  Assessment and Plan  Anxiety and depression - Plan: venlafaxine XR (EFFEXOR-XR) 75 MG 24 hr capsule  Hypertriglyceridemia - Plan: Lipid panel  Elevated AST (SGOT) - Plan: Hepatitis C antibody, Ferritin, IBC panel, Hepatitis B Surface AntiGEN  Orders as above. We have decided to keep Effexor at same dose. Well done on weight loss. Keep it up. F/u in 3 mo. Sooner pending above. The patient voiced understanding and agreement to the plan.  Jilda Rocheicholas Paul [place], DO 07/22/16 7:45 AM

## 2016-08-05 ENCOUNTER — Institutional Professional Consult (permissible substitution): Payer: BLUE CROSS/BLUE SHIELD | Admitting: Pulmonary Disease

## 2016-09-20 ENCOUNTER — Ambulatory Visit (INDEPENDENT_AMBULATORY_CARE_PROVIDER_SITE_OTHER): Payer: BLUE CROSS/BLUE SHIELD | Admitting: Pulmonary Disease

## 2016-09-20 ENCOUNTER — Encounter: Payer: Self-pay | Admitting: Pulmonary Disease

## 2016-09-20 DIAGNOSIS — G4733 Obstructive sleep apnea (adult) (pediatric): Secondary | ICD-10-CM

## 2016-09-20 NOTE — Progress Notes (Signed)
Subjective:    Patient ID: Stuart GoslingMatthew B Taylor, male    DOB: July 25, 1970, 46 y.o.   MRN: [id number]  HPI  Chief Complaint  Patient presents with  . Sleep Consult    Referred by Dr. Carmelia RollerWendling for snoring. Has been going on for a while. Pt states he has never had a SS done before.     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 He reports excessive daytime somnolence and loud snoring that has been noted by his mom and his roommate. There also witnessed apneas. Epworth sleepiness score is 16 and a report sleepiness and radius social situations, watching TV, as a passenger in a car or in the afternoons when doing nothing. Bedtime is between 10 PM and midnight, the TV stays on through the night, sleep latency can vary from a few minutes to up to 2 hours, he sleeps in no preferred position with one pillow, reports one to 2 nocturnal awakenings including nocturia. Nocturia has much improved since he is started taking Flomax. He is out of bed at 8 AM feeling tired with dryness of mouth and occasional headache. On weekends he will nap for about 2 hours in his recliner and wakes up feeling better rested. He is gained considerable weight from 250 up to 350 pounds, down lost some now to his current weight of 297 pounds. He has anxiety and has been taking Effexor for 5 years.  There is no history suggestive of cataplexy, sleep paralysis or parasomnias    Past Medical History:  Diagnosis Date  . Anxiety and depression   . History of chicken pox      Past Surgical History:  Procedure Laterality Date  . EYE SURGERY     x4, [3]Left, [2] Right  . I&D EXTREMITY Left 12/16/2014   Procedure: IRRIGATION AND DEBRIDEMENT EXTREMITY;  Surgeon: Bradly BienenstockFred Ortmann, MD;  Location: [facility] OR;  Service: Orthopedics;  Laterality: Left;  . TONSILLECTOMY      Allergies  Allergen Reactions  . Poison Oak Extract [Poison Oak Extract] Anaphylaxis, Swelling and Rash   Social  History   Social History  . Marital status: Single    Spouse name: N/A  . Number of children: N/A  . Years of education: N/A   Occupational History  . Not on file.   Social History Main Topics  . Smoking status: Former Smoker    Packs/day: 0.50    Years: 7.00    Types: Cigarettes    Quit date: 04/19/2016  . Smokeless tobacco: Never Used  . Alcohol use No  . Drug use: No  . Sexual activity: Not on file   Other Topics Concern  . Not on file   Social History Narrative  . No narrative on file      Family History  Problem Relation Age of Onset  . COPD Mother     Living  . Anxiety disorder Mother   . GER disease Mother   . Congestive Heart Failure Mother   . Sleep apnea Mother   . Arthritis Mother   . Kidney disease Mother   . Hypothyroidism Mother   . Hyperlipidemia Mother   . Pulmonary embolism Mother   . Anemia Mother   . Heart attack Father [id number]    Deceased  . Deep vein thrombosis Father   . Cancer Father [id number]    Ureteral  . Mental illness Sister      Review of Systems   Positive for tooth pain and anxiety  Constitutional: negative for anorexia, fevers and sweats  Eyes: negative for irritation, redness and visual disturbance  Ears, nose, mouth, throat, and face: negative for earaches, epistaxis, nasal congestion and sore throat  Respiratory: negative for cough, dyspnea on exertion, sputum and wheezing  Cardiovascular: negative for chest pain, dyspnea, lower extremity edema, orthopnea, palpitations and syncope  Gastrointestinal: negative for abdominal pain, constipation, diarrhea, melena, nausea and vomiting  Genitourinary:negative for dysuria, frequency and hematuria  Hematologic/lymphatic: negative for bleeding, easy bruising and lymphadenopathy  Musculoskeletal:negative for arthralgias, muscle weakness and stiff joints  Neurological: negative for coordination problems, gait problems, headaches and weakness  Endocrine: negative for diabetic symptoms  including polydipsia, polyuria and weight loss     Objective:   Physical Exam  Gen. Pleasant, obese, in no distress, normal affect ENT - no lesions, no post nasal drip, class 2-3 airway Neck: No JVD, no thyromegaly, no carotid bruits Lungs: no use of accessory muscles, no dullness to percussion, decreased without rales or rhonchi  Cardiovascular: Rhythm regular, heart sounds  normal, no murmurs or gallops, no peripheral edema Abdomen: soft and non-tender, no hepatosplenomegaly, BS normal. Musculoskeletal: No deformities, no cyanosis or clubbing Neuro:  alert, non focal, no tremors       Assessment & Plan:

## 2016-09-20 NOTE — Assessment & Plan Note (Addendum)
Given excessive daytime somnolence, narrow pharyngeal exam, witnessed apneas & loud snoring, obstructive sleep apnea is very likely & an overnight polysomnogram will be scheduled as a split study. The pathophysiology of obstructive sleep apnea , it's cardiovascular consequences & modes of treatment including CPAP were discused with the patient in detail & they evidenced understanding.  Pretest probability is high 

## 2016-09-20 NOTE — Patient Instructions (Signed)
Sleep study

## 2016-10-12 ENCOUNTER — Encounter: Payer: Self-pay | Admitting: Family Medicine

## 2016-10-18 ENCOUNTER — Other Ambulatory Visit: Payer: Self-pay

## 2016-10-18 DIAGNOSIS — G4733 Obstructive sleep apnea (adult) (pediatric): Secondary | ICD-10-CM

## 2016-10-22 ENCOUNTER — Ambulatory Visit (INDEPENDENT_AMBULATORY_CARE_PROVIDER_SITE_OTHER): Payer: BLUE CROSS/BLUE SHIELD | Admitting: Family Medicine

## 2016-10-22 ENCOUNTER — Encounter: Payer: Self-pay | Admitting: Family Medicine

## 2016-10-22 VITALS — BP 110/72 | HR 57 | Temp 97.8°F | Ht 70.75 in | Wt 296.4 lb

## 2016-10-22 DIAGNOSIS — E781 Pure hyperglyceridemia: Secondary | ICD-10-CM | POA: Diagnosis not present

## 2016-10-22 DIAGNOSIS — R3915 Urgency of urination: Secondary | ICD-10-CM

## 2016-10-22 DIAGNOSIS — F419 Anxiety disorder, unspecified: Secondary | ICD-10-CM

## 2016-10-22 DIAGNOSIS — N401 Enlarged prostate with lower urinary tract symptoms: Secondary | ICD-10-CM

## 2016-10-22 DIAGNOSIS — F32A Depression, unspecified: Secondary | ICD-10-CM

## 2016-10-22 DIAGNOSIS — F329 Major depressive disorder, single episode, unspecified: Secondary | ICD-10-CM

## 2016-10-22 LAB — LIPID PANEL
CHOL/HDL RATIO: 6
Cholesterol: 181 mg/dL (ref 0–200)
HDL: 28.3 mg/dL — ABNORMAL LOW (ref >39.00)
NONHDL: 152.33
Triglycerides: 257 mg/dL — ABNORMAL HIGH (ref 0.0–149.0)
VLDL: 51.4 mg/dL — AB (ref 0.0–40.0)

## 2016-10-22 LAB — LDL CHOLESTEROL, DIRECT: LDL DIRECT: 107 mg/dL

## 2016-10-22 MED ORDER — VENLAFAXINE HCL ER 75 MG PO CP24: 75.0000 mg | ORAL_CAPSULE | Freq: Every day | ORAL | 11 refills | Status: DC

## 2016-10-22 MED ORDER — TAMSULOSIN HCL 0.4 MG PO CAPS: 0.4000 mg | ORAL_CAPSULE | Freq: Every day | ORAL | 11 refills | Status: DC

## 2016-10-22 NOTE — Patient Instructions (Signed)
Keep up the good work!  Come see us if you need us.  Give us 2-3 business days to get the results of your labs back.

## 2016-10-22 NOTE — Progress Notes (Signed)
Chief Complaint  Patient presents with  . Follow-up    3  mos on anxiety and depression-pt states things are going good    Subjective: Patient is a 46 y.o. male here for med f/u.  He has been doing very well on Effexor XR 75 mg daily and compliance has been excellent. No side effects noted. Feels his symptoms are well controlled and does not believe his dose should change. Doing well with walking his dog and meal-prepping. No SI or HI. No self medication. He does not follow with a counselor.   Hypertriglyceridemia TG's >400 in Jan, lost 30 lbs and came down to 180's.  He has lost 22 lbs since 3 mo ago.  No CP or SOB.  He is not taking any medication routinely for cholesterol. 10 yr CVD is 3.0% as of Jan.  BPH BPH well controlled with Flomax 0.4 mg daily. Tolerating medicine well, compliant. No lightheadedness, incomplete emptying, weak stream or frequent nighttime awakenings.  ROS: Heart: Denies chest pain  Lungs: Denies SOB  Psych: As noted in HPI  Family History  Problem Relation Age of Onset  . COPD Mother        Living  . Anxiety disorder Mother   . GER disease Mother   . Congestive Heart Failure Mother   . Sleep apnea Mother   . Arthritis Mother   . Kidney disease Mother   . Hypothyroidism Mother   . Hyperlipidemia Mother   . Pulmonary embolism Mother   . Anemia Mother   . Heart attack Father [id number]       Deceased  . Deep vein thrombosis Father   . Cancer Father [id number]       Ureteral  . Mental illness Sister    Past Medical History:  Diagnosis Date  . Anxiety and depression   . History of chicken pox    Allergies  Allergen Reactions  . Poison Oak Extract [Poison Oak Extract] Anaphylaxis, Swelling and Rash    Current Outpatient Prescriptions:  .  pyridOXINE (VITAMIN B-6) 25 MG tablet, Take 25 mg by mouth daily., Disp: , Rfl:  .  tamsulosin (FLOMAX) 0.4 MG CAPS capsule, Take 1 capsule (0.4 mg total) by mouth daily., Disp: 30 capsule, Rfl: 11 .   venlafaxine XR (EFFEXOR-XR) 75 MG 24 hr capsule, Take 1 capsule (75 mg total) by mouth daily with breakfast., Disp: 30 capsule, Rfl: 11  Objective: BP 110/72 (BP Location: Left Arm, Patient Position: Sitting, Cuff Size: Large)   Pulse (!) 57   Temp 97.8 F (36.6 C) (Oral)   Ht 5' 10.75" (1.797 m)   Wt 296 lb 6.4 oz (134.4 kg)   SpO2 97%   BMI 41.63 kg/m  General: Awake, appears stated age HEENT: MMM, EOMi Heart: RRR, no murmurs Lungs: CTAB, no rales, wheezes or rhonchi. No accessory muscle use Psych: Age appropriate judgment and insight, normal affect and mood   Assessment and Plan: Anxiety and depression - Plan: venlafaxine XR (EFFEXOR-XR) 75 MG 24 hr capsule  Benign prostatic hyperplasia (BPH) with urinary urgency - Plan: tamsulosin (FLOMAX) 0.4 MG CAPS capsule  Hypertriglyceridemia - Plan: Lipid panel  Orders as above. Doing well. Counseled on diet and exercise, challenged him to lift weights. Check TG's today, no rec for statin or fibrate at this time. F/u in 6 mo or prn. The patient voiced understanding and agreement to the plan.  Jilda Rocheicholas Paul [place], DO 10/22/16  8:11 AM

## 2016-11-11 ENCOUNTER — Encounter (HOSPITAL_BASED_OUTPATIENT_CLINIC_OR_DEPARTMENT_OTHER): Payer: BLUE CROSS/BLUE SHIELD

## 2016-11-14 HISTORY — PX: APPENDECTOMY: SHX54

## 2016-11-27 IMAGING — CT CT ELBOW*L* W/CM
4 of 6 series · 14 of 33 positions shown, 16 images · IV contrast (omnipaque)
Comparison: None.

CLINICAL DATA: Left arm swelling for 1 week with redness pain
around the elbow no trauma, no known insect bite, getting worse with
low-grade fever.

EXAM:
CT OF THE LEFT ELBOW WITH CONTRAST
TECHNIQUE: Multidetector CT imaging was performed following the standard
protocol during bolus administration of intravenous contrast.
CONTRAST:  100mL OMNIPAQUE IOHEXOL 300 MG/ML  SOLN

[Series 3: left elbow 3.0 b40s · axial · 0.34mm/px · z∈[+77,+113]mm · 2 of 61 slices shown]
[im 13/61  bone]
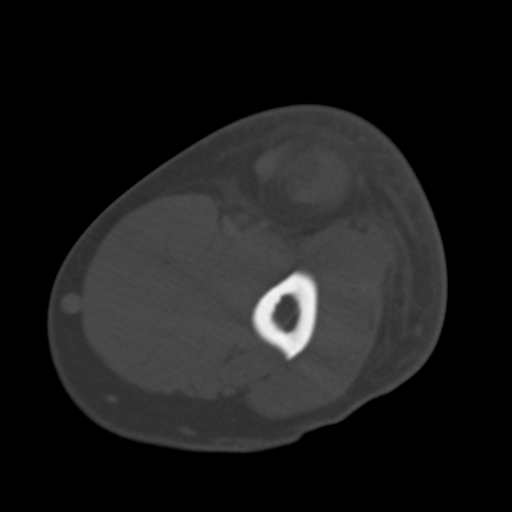
[im 25/61  bone]
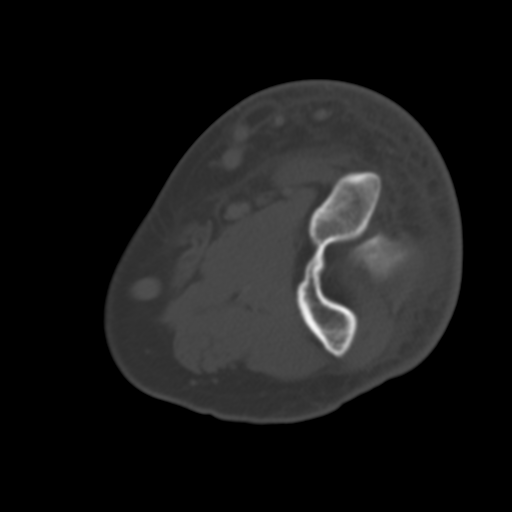

[Series 6: coronal bone · coronal · 0.33mm/px · 1 of 54 slices shown]
[im 27/54  bone]
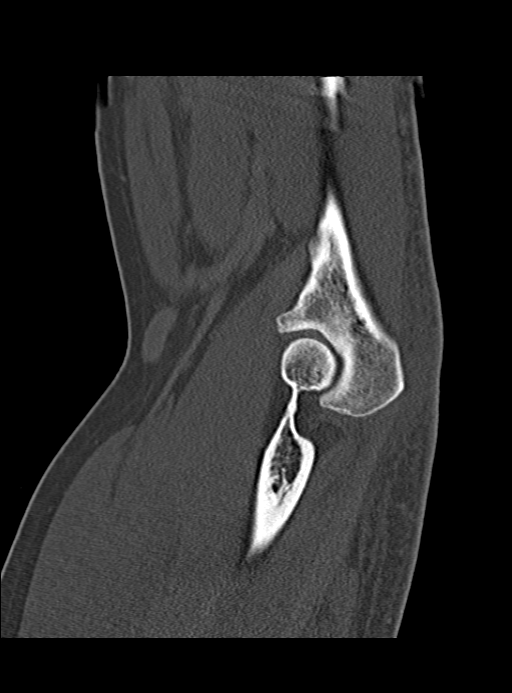

[Series 7: sagittal bone · sagittal · 0.34mm/px · 5 of 58 slices shown]
[im 10/58  bone]
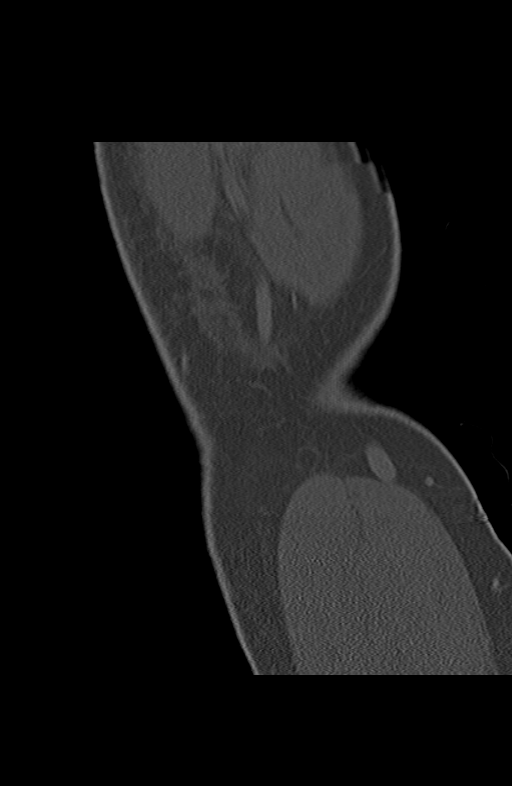
[im 20/58  bone]
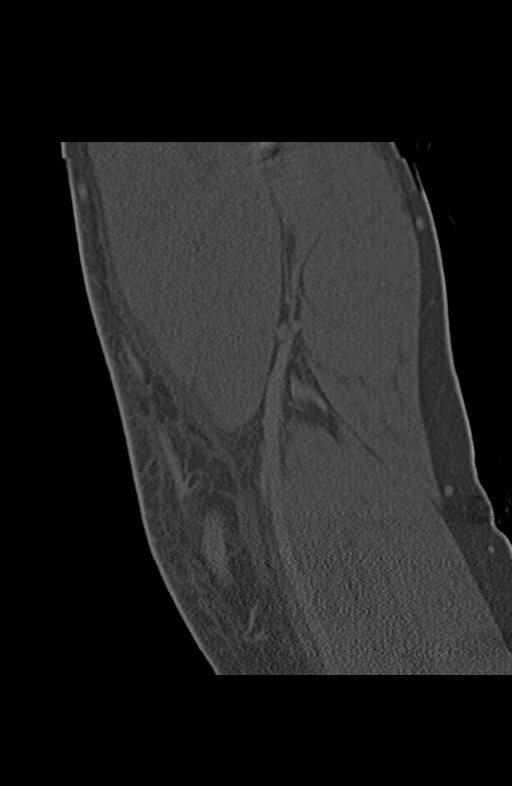
[im 29/58  bone]
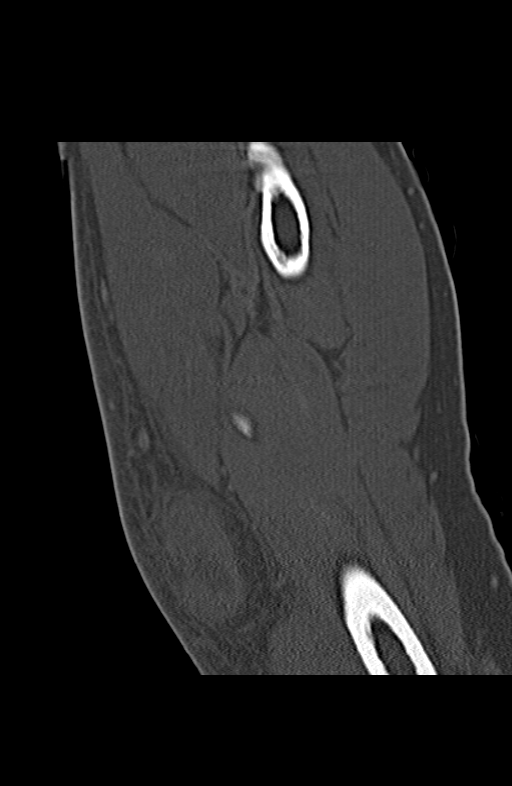
[im 39/58  bone]
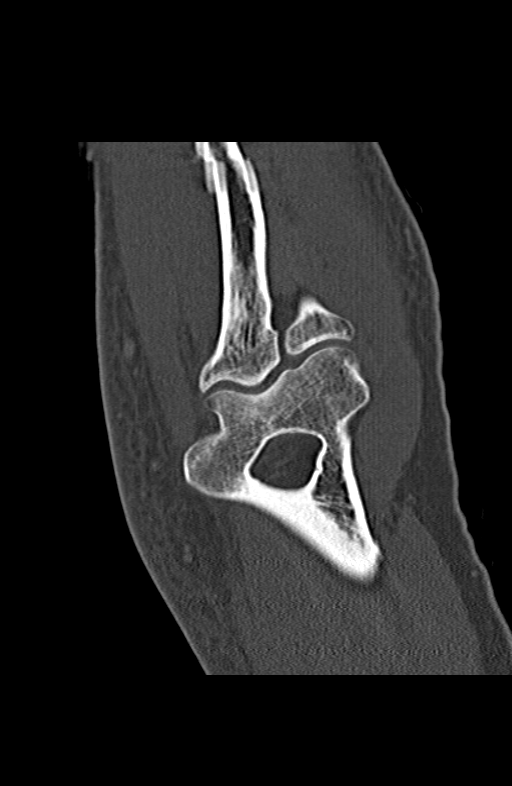
[im 48/58  bone]
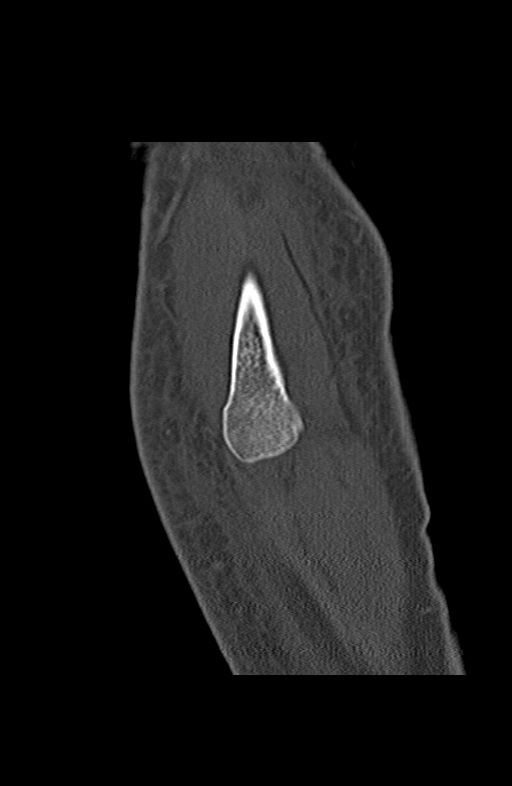

[Series 602: axial st recons · axial · 0.36mm/px · z∈[+52,+162]mm · 6 of 80 slices shown, 8 images]
[im 12/80  soft-tissue]
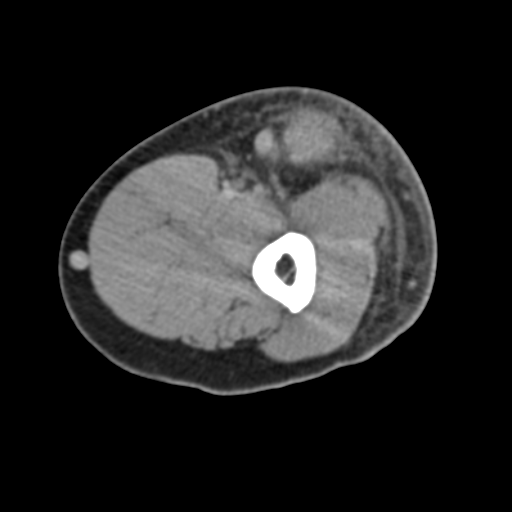
[im 12/80  bone]
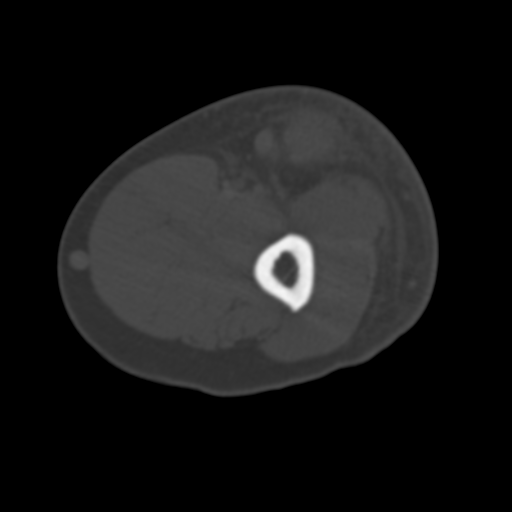
[im 23/80  bone]
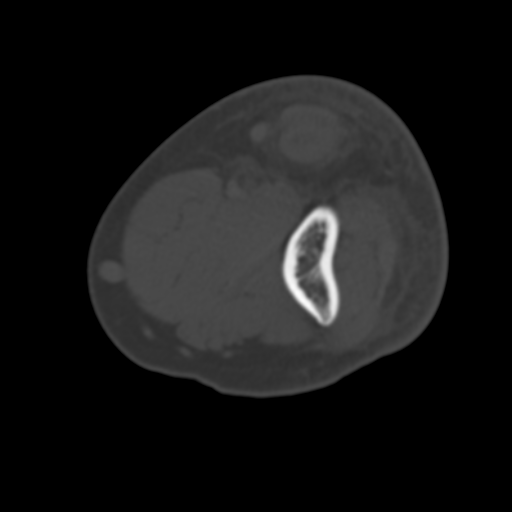
[im 34/80  bone]
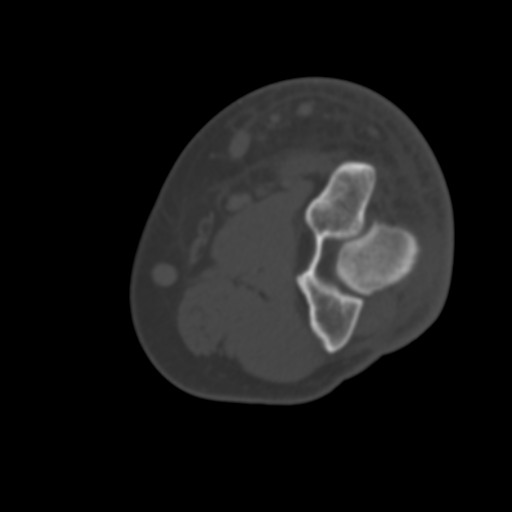
[im 46/80  bone]
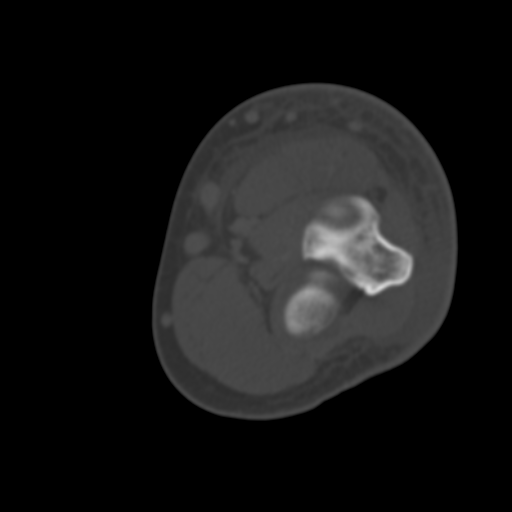
[im 57/80  soft-tissue]
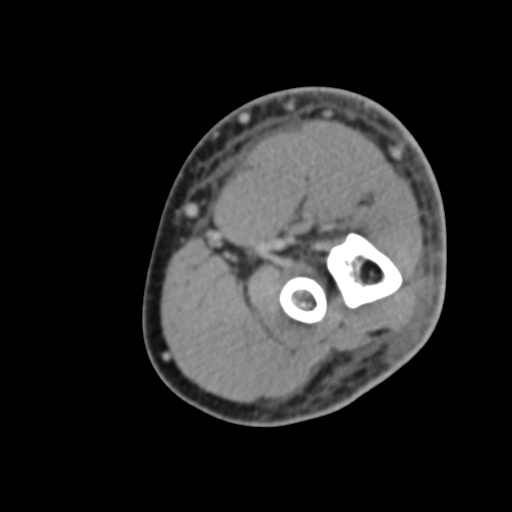
[im 57/80  bone]
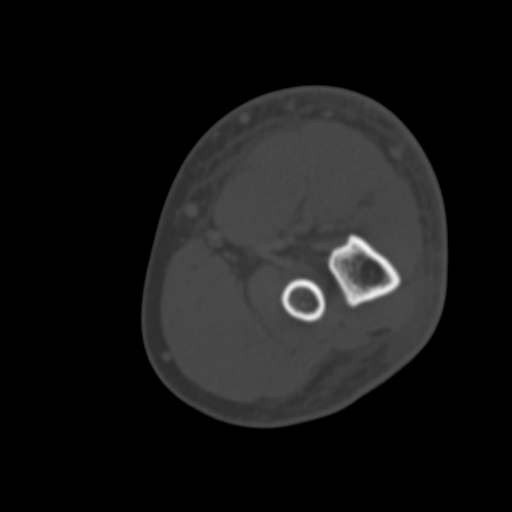
[im 68/80  bone]
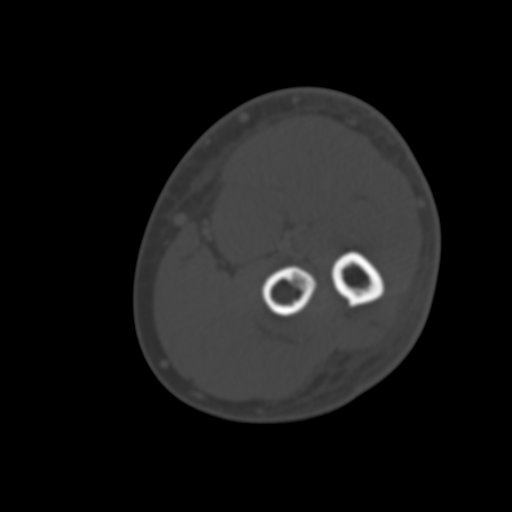

[14 of 33 positions shown; findings below may reference images not displayed]

FINDINGS: There is increased attenuation in the subcutaneous dorsal soft
tissues extending at least to the level of the mid shaft of the
humerus, the cranial most edge of the scan volume, down through the
elbow joint and distally into the dorsal aspect of the proximal
forearm. There is mild motion artifact, but there is no evidence of
periosteal reaction or cortical destruction. No focal osseous
abnormalities are identified. There is no elbow joint effusion.
There are is an oval ill-defined focus of soft tissue attenuation,
heterogeneous, in the subcutaneous soft tissues between the flexor
musculature and deep layer of the dermis, measuring 4.4 x 2.1 cm,
medial to the distal humerus from the level of the distal diaphysis
to the medial humeral condyle. This exerts mass effect on
surrounding soft tissues. Average attenuation is 60, with a central
area of decreased attenuation measuring about 5.
IMPRESSION: Evidence of diffuse cellulitis of the dorsal aspect of the upper
extremity from at least the level of the mid humerus extending into
the proximal forearm. 4 cm focus of ill-defined heterogeneous soft
tissue suggesting phlegmon. Puncture wound or insect bite are
possible considerations. No evidence of osseous involvement.

## 2016-12-30 ENCOUNTER — Encounter: Payer: Self-pay | Admitting: Family Medicine

## 2016-12-30 NOTE — Telephone Encounter (Signed)
I don't think he is referring to our office, but maybe downstairs?

## 2017-04-25 ENCOUNTER — Ambulatory Visit: Payer: BLUE CROSS/BLUE SHIELD | Admitting: Family Medicine

## 2017-04-27 ENCOUNTER — Encounter: Payer: Self-pay | Admitting: Family Medicine

## 2017-04-27 ENCOUNTER — Ambulatory Visit: Payer: BLUE CROSS/BLUE SHIELD | Admitting: Family Medicine

## 2017-04-27 VITALS — BP 120/82 | HR 77 | Temp 98.3°F | Ht 70.0 in | Wt 321.1 lb

## 2017-04-27 DIAGNOSIS — F329 Major depressive disorder, single episode, unspecified: Secondary | ICD-10-CM

## 2017-04-27 DIAGNOSIS — Z23 Encounter for immunization: Secondary | ICD-10-CM

## 2017-04-27 DIAGNOSIS — N401 Enlarged prostate with lower urinary tract symptoms: Secondary | ICD-10-CM

## 2017-04-27 DIAGNOSIS — F419 Anxiety disorder, unspecified: Secondary | ICD-10-CM

## 2017-04-27 DIAGNOSIS — R3915 Urgency of urination: Secondary | ICD-10-CM

## 2017-04-27 DIAGNOSIS — F32A Depression, unspecified: Secondary | ICD-10-CM

## 2017-04-27 NOTE — Progress Notes (Signed)
Chief Complaint  Patient presents with  . Follow-up    Subjective Stuart GoslingMatthew B Taylor presents for f/u anxiety/depression.  He is currently being treated with Effexor 75 mg XR qd.  Reports excellent control of issues since treatment. No thoughts of harming self or others. No self-medication with alcohol, prescription drugs or illicit drugs. Pt is not following with a counselor/psychologist.  ROS Psych: No homicidal or suicidal thoughts  Past Medical History:  Diagnosis Date  . Anxiety and depression   . History of chicken pox    Allergies as of 04/27/2017      Reactions   Poison Oak Extract [poison Oak Extract] Anaphylaxis, Swelling, Rash      Medication List        Accurate as of 04/27/17 10:30 AM. Always use your most recent med list.          pyridOXINE 25 MG tablet Commonly known as:  VITAMIN B-6 Take 25 mg by mouth daily.   tamsulosin 0.4 MG Caps capsule Commonly known as:  FLOMAX Take 1 capsule (0.4 mg total) by mouth daily.   venlafaxine XR 75 MG 24 hr capsule Commonly known as:  EFFEXOR-XR Take 1 capsule (75 mg total) by mouth daily with breakfast.       Exam BP 120/82 (BP Location: Left Arm, Patient Position: Sitting, Cuff Size: Large)   Pulse 77   Temp 98.3 F (36.8 C) (Oral)   Ht 5\' 10"  (1.778 m)   Wt (!) 321 lb 2 oz (145.7 kg)   SpO2 97%   BMI 46.08 kg/m  General:  well developed, well nourished, in no apparent distress Neck: neck supple without adenopathy, thyromegaly, or masses Lungs:  clear to auscultation, breath sounds equal bilaterally, no respiratory distress Cardio:  regular rate and rhythm without murmurs, heart sounds without clicks or rubs Psych: well oriented with normal range of affect and age-appropriate judgement/insight, alert and oriented x4.  Assessment and Plan  Anxiety and depression  Need for influenza vaccination - Plan: Flu Vaccine QUAD 6+ mos PF IM (Fluarix Quad PF)  Morbid obesity (HCC) - Plan: Amb Ref to Medical  Weight Management  Benign prostatic hyperplasia (BPH) with urinary urgency  Orders as above. Cont Effexor.  F/u in 6 mo for CPE, will check PSA given early onset of LUS.  The patient voiced understanding and agreement to the plan.  Jilda Rocheicholas Paul SonoraWendling, DO 04/27/17 10:30 AM

## 2017-04-27 NOTE — Progress Notes (Signed)
Pre visit review using our clinic review tool, if applicable. No additional management support is needed unless otherwise documented below in the visit note. 

## 2017-04-27 NOTE — Patient Instructions (Signed)
If you do not hear anything about your referral in the next 1-2 weeks, call our office and ask for an update.  Try to get back in the gym.   Let us know if you need anything.

## 2017-04-28 ENCOUNTER — Encounter: Payer: Self-pay | Admitting: Family Medicine

## 2017-04-28 ENCOUNTER — Telehealth: Payer: Self-pay | Admitting: Family Medicine

## 2017-04-28 NOTE — Telephone Encounter (Signed)
Completed the letter/called the patient left message to call back.

## 2017-04-28 NOTE — Telephone Encounter (Signed)
Called left message to call back 

## 2017-04-28 NOTE — Telephone Encounter (Signed)
OK to write a letter stating that to the best of our knowledge, he has not smoked a cigarette since 04/19/16. TY.

## 2017-04-28 NOTE — Telephone Encounter (Signed)
Copied from CRM #[id number]. Topic: General - Other >> Apr 28, 2017  2:54 PM Oneal GroutSebastian, Jennifer S wrote: Reason for CRM: Need letter stating he has not smoke for over a year, need for life insurance

## 2017-04-29 NOTE — Telephone Encounter (Signed)
Called left another message that letter is completed and at the front desk for pickup

## 2017-06-07 ENCOUNTER — Telehealth: Payer: Self-pay | Admitting: Family Medicine

## 2017-06-07 NOTE — Telephone Encounter (Signed)
Patient letter was mailed out on 06/07/2017 to address on file front the front office RX pick up bin.

## 2017-08-29 ENCOUNTER — Encounter: Payer: Self-pay | Admitting: Family Medicine

## 2017-09-05 ENCOUNTER — Ambulatory Visit: Payer: BLUE CROSS/BLUE SHIELD | Admitting: Family Medicine

## 2017-09-05 ENCOUNTER — Encounter: Payer: Self-pay | Admitting: Family Medicine

## 2017-09-05 ENCOUNTER — Encounter: Payer: Self-pay | Admitting: Gastroenterology

## 2017-09-05 VITALS — BP 138/92 | HR 86 | Temp 98.3°F | Ht 70.0 in | Wt 330.5 lb

## 2017-09-05 DIAGNOSIS — L309 Dermatitis, unspecified: Secondary | ICD-10-CM

## 2017-09-05 DIAGNOSIS — K921 Melena: Secondary | ICD-10-CM

## 2017-09-05 MED ORDER — TRIAMCINOLONE ACETONIDE 0.1 % EX CREA: 1.0000 "application " | TOPICAL_CREAM | Freq: Two times a day (BID) | CUTANEOUS | 0 refills | Status: DC

## 2017-09-05 MED ORDER — LEVOCETIRIZINE DIHYDROCHLORIDE 5 MG PO TABS: 5.0000 mg | ORAL_TABLET | Freq: Every evening | ORAL | 3 refills | Status: DC

## 2017-09-05 NOTE — Progress Notes (Signed)
Chief Complaint  Patient presents with  . Rash  . Blood In Stools    Stuart GoslingMatthew B Taylor is a 47 y.o. male here for a skin complaint.  Duration: 1 week Location: arms and legs Pruritic? Yes Painful? No Drainage? No New soaps/lotions/topicals/detergents? No Sick contacts? No Other associated symptoms: Happens around the same time every year Therapies tried thus far: Topical cortisone cream  Had episode of rectal bleeding this past weekend.  Blood was in both stool and on toilet tissue.  Denies any history of straining, injury, or hemorrhoids.  No family history of colon cancer.  He is not having any pain.  His most recent bowel movement in the last 24 hours was normal.  ROS:  Const: No fevers  Skin: As noted in HPI  Past Medical History:  Diagnosis Date  . Anxiety and depression   . History of chicken pox    Allergies  Allergen Reactions  . Poison Oak Extract [Poison Oak Extract] Anaphylaxis, Swelling and Rash   Allergies as of 09/05/2017      Reactions   Poison Oak Extract [poison Oak Extract] Anaphylaxis, Swelling, Rash      Medication List        Accurate as of 09/05/17 12:50 PM. Always use your most recent med list.          levocetirizine 5 MG tablet Commonly known as:  XYZAL Take 1 tablet (5 mg total) by mouth every evening.   pyridOXINE 25 MG tablet Commonly known as:  VITAMIN B-6 Take 25 mg by mouth daily.   tamsulosin 0.4 MG Caps capsule Commonly known as:  FLOMAX Take 1 capsule (0.4 mg total) by mouth daily.   triamcinolone cream 0.1 % Commonly known as:  KENALOG Apply 1 application topically 2 (two) times daily. For 10 days   venlafaxine XR 75 MG 24 hr capsule Commonly known as:  EFFEXOR-XR Take 1 capsule (75 mg total) by mouth daily with breakfast.       BP (!) 138/92 (BP Location: Left Arm, Patient Position: Sitting, Cuff Size: Large)   Pulse 86   Temp 98.3 F (36.8 C) (Oral)   Ht 5\' 10"  (1.778 m)   Wt (!) 330 lb 8 oz (149.9 kg)   SpO2  97%   BMI 47.42 kg/m  Gen: awake, alert, appearing stated age Lungs: No accessory muscle use Abd: Soft, NT, ND, BS+ Rectal: No ext lesions or fissures, sphincter of good tone, no masses felt in the vault, perhaps a small internal hemorrhoid appreciated, no frank blood Skin: Erythematous and scaling patches over UE and LE's b/l. No drainage, TTP, fluctuance, excoriation Psych: Age appropriate judgment and insight  Eczema, unspecified type - Plan: triamcinolone cream (KENALOG) 0.1 %, levocetirizine (XYZAL) 5 MG tablet  Blood in stool - Plan: Ambulatory referral to Gastroenterology  Orders as above. As this happens the same time every year, likely an allergic component. Start PO antihistamine, Kenalog topical for itching/inflammation. Nonscented moisturizing also rec'd.  Discussed watchful waiting/treating for hemorrhoid versus referring to gastroenterology for further evaluation.  He opted for the latter. Hemoccult testing is negative. BP mildly elevated. F/u prn, if home bp readings are wnl. The patient voiced understanding and agreement to the plan.  Stuart Taylor Grove[place], DO 09/05/17 12:50 PM

## 2017-09-05 NOTE — Patient Instructions (Signed)
If you do not hear anything about your referral in the next 1-2 weeks, call our office and ask for an update.  Use a non-scented lotion at least twice daily to keep skin moisturized.   Let us know if you need anything.

## 2017-09-05 NOTE — Progress Notes (Signed)
Pre visit review using our clinic review tool, if applicable. No additional management support is needed unless otherwise documented below in the visit note. 

## 2017-10-14 ENCOUNTER — Ambulatory Visit: Payer: BLUE CROSS/BLUE SHIELD | Admitting: Gastroenterology

## 2017-10-18 ENCOUNTER — Encounter: Payer: Self-pay | Admitting: Family Medicine

## 2017-10-24 ENCOUNTER — Encounter: Payer: Self-pay | Admitting: Gastroenterology

## 2017-10-26 ENCOUNTER — Encounter: Payer: BLUE CROSS/BLUE SHIELD | Admitting: Family Medicine

## 2017-10-26 ENCOUNTER — Telehealth: Payer: Self-pay

## 2017-10-26 NOTE — Telephone Encounter (Signed)
[facility] pt. request. Appointment made for 6/28 at 1030AM.

## 2017-10-31 ENCOUNTER — Encounter: Payer: Self-pay | Admitting: Family Medicine

## 2017-11-01 ENCOUNTER — Other Ambulatory Visit: Payer: Self-pay | Admitting: Family Medicine

## 2017-11-01 DIAGNOSIS — R3915 Urgency of urination: Principal | ICD-10-CM

## 2017-11-01 DIAGNOSIS — N401 Enlarged prostate with lower urinary tract symptoms: Secondary | ICD-10-CM

## 2017-11-01 DIAGNOSIS — F32A Depression, unspecified: Secondary | ICD-10-CM

## 2017-11-01 DIAGNOSIS — F419 Anxiety disorder, unspecified: Secondary | ICD-10-CM

## 2017-11-01 DIAGNOSIS — F329 Major depressive disorder, single episode, unspecified: Secondary | ICD-10-CM

## 2017-11-01 NOTE — Telephone Encounter (Signed)
Copied from CRM [id number]. Topic: Quick Communication - Rx Refill/Question >> Nov 01, 2017  3:37 PM Tamela OddiMartin, Don'Quashia, NT wrote: Medication: tamsulosin (FLOMAX) 0.4 MG CAPS capsule and venlafaxine XR (EFFEXOR-XR) 75 MG 24 hr capsule  Has the patient contacted their pharmacy? No, no usually  calls the office to get refill  (Agent: If no, request that the patient contact the pharmacy for the refill.) (Agent: If yes, when and what did the pharmacy advise?)  Preferred Pharmacy (with phone number or street name): [facility] - [place], [place] - [id number] [phone] (Phone) [phone] (Fax)      Agent: Please be advised that RX refills may take up to 3 business days. We ask that you follow-up with your pharmacy.

## 2017-11-02 NOTE — Telephone Encounter (Signed)
Flomax and Effexor refills Last OV:04/27/17; Upcoming 11/11/17 for CPE Last refill:10/22/16 30 cap/11 refills for both medications ZOX:[id number]:Wendling [facility] [address] - [place], [place] - [address] [phone] (Phone) [phone] (Fax)

## 2017-11-03 MED ORDER — VENLAFAXINE HCL ER 75 MG PO CP24: 75.0000 mg | ORAL_CAPSULE | Freq: Every day | ORAL | 11 refills | Status: DC

## 2017-11-03 MED ORDER — TAMSULOSIN HCL 0.4 MG PO CAPS
0.4000 mg | ORAL_CAPSULE | Freq: Every day | ORAL | 11 refills | Status: DC
Start: 2017-11-03 — End: 2017-11-11

## 2017-11-11 ENCOUNTER — Ambulatory Visit (INDEPENDENT_AMBULATORY_CARE_PROVIDER_SITE_OTHER): Payer: BLUE CROSS/BLUE SHIELD | Admitting: Family Medicine

## 2017-11-11 ENCOUNTER — Encounter: Payer: Self-pay | Admitting: Family Medicine

## 2017-11-11 VITALS — BP 118/80 | HR 90 | Temp 98.7°F | Ht 70.0 in | Wt 329.4 lb

## 2017-11-11 DIAGNOSIS — N401 Enlarged prostate with lower urinary tract symptoms: Secondary | ICD-10-CM | POA: Diagnosis not present

## 2017-11-11 DIAGNOSIS — F329 Major depressive disorder, single episode, unspecified: Secondary | ICD-10-CM | POA: Diagnosis not present

## 2017-11-11 DIAGNOSIS — Z0000 Encounter for general adult medical examination without abnormal findings: Secondary | ICD-10-CM | POA: Diagnosis not present

## 2017-11-11 DIAGNOSIS — Z125 Encounter for screening for malignant neoplasm of prostate: Secondary | ICD-10-CM | POA: Diagnosis not present

## 2017-11-11 DIAGNOSIS — L989 Disorder of the skin and subcutaneous tissue, unspecified: Secondary | ICD-10-CM | POA: Diagnosis not present

## 2017-11-11 DIAGNOSIS — F419 Anxiety disorder, unspecified: Secondary | ICD-10-CM | POA: Diagnosis not present

## 2017-11-11 DIAGNOSIS — R3915 Urgency of urination: Secondary | ICD-10-CM | POA: Diagnosis not present

## 2017-11-11 DIAGNOSIS — F32A Depression, unspecified: Secondary | ICD-10-CM

## 2017-11-11 MED ORDER — VENLAFAXINE HCL ER 37.5 MG PO CP24: 37.5000 mg | ORAL_CAPSULE | Freq: Every day | ORAL | 2 refills | Status: DC

## 2017-11-11 MED ORDER — FINASTERIDE 5 MG PO TABS: 5.0000 mg | ORAL_TABLET | Freq: Every day | ORAL | 5 refills | Status: DC

## 2017-11-11 NOTE — Patient Instructions (Addendum)
Let us know if you need anything.  Give us 2-3 business days to get the results of your labs back. If labs are normal, you will likely receive a letter in the mail unless you have MyChart. This can take longer than 2-3 business days.   Take the new and lower dose of the venlafaxine. If you do well for 2 weeks, you can take it every other day for 2 weeks and then stop. Let me know if you would like to try Prozac as this is weight neutral.   If you do not hear anything about your referral in the next 1-2 weeks, call our office and ask for an update.  Let us know if you need anything.

## 2017-11-11 NOTE — Progress Notes (Signed)
Pre visit review using our clinic review tool, if applicable. No additional management support is needed unless otherwise documented below in the visit note. 

## 2017-11-11 NOTE — Progress Notes (Signed)
Chief Complaint  Patient presents with  . Annual Exam    Well Male Stuart GoslingMatthew B Taylor is here for a complete physical.   His last physical was >1 year ago.  Current diet: in general, an "up and down" diet.   Current exercise: some walking Weight trend: stable Does pt snore? No. Daytime fatigue? No. Seat belt? Yes.    Health maintenance Tetanus- Yes HIV- Yes   Patient did bring up that he would like to come off of the Effexor.  He believes it is contributing to his recent weight gain.  He feels his mood is well controlled at this time.  The patient also states he has felt like he was going to pass out on a nearly daily basis.  Usually happens when he stands up.  He states he does stand up slow.  Of note he is on Flomax.  He also feels he is urinating more frequently despite being on Flomax.  No pain or bleeding.  Patient also has a history of skin reactions at his very specific time of year.  It is very bothersome when he has it.  It always resolves after coming on the same time.  He is wondering if there is anything that he is being exposed to that is causing this.  Past Medical History:  Diagnosis Date  . Anxiety and depression   . History of chicken pox      Past Surgical History:  Procedure Laterality Date  . EYE SURGERY     x4, [3]Left, [2] Right  . I&D EXTREMITY Left 12/16/2014   Procedure: IRRIGATION AND DEBRIDEMENT EXTREMITY;  Surgeon: Bradly BienenstockFred Ortmann, MD;  Location: [facility] OR;  Service: Orthopedics;  Laterality: Left;  . TONSILLECTOMY      Medications  Current Outpatient Medications on File Prior to Visit  Medication Sig Dispense Refill  . levocetirizine (XYZAL) 5 MG tablet Take 1 tablet (5 mg total) by mouth every evening. 30 tablet 3  . pyridOXINE (VITAMIN B-6) 25 MG tablet Take 25 mg by mouth daily.    . tamsulosin (FLOMAX) 0.4 MG CAPS capsule Take 1 capsule (0.4 mg total) by mouth daily. 30 capsule 11  . triamcinolone cream (KENALOG) 0.1 % Apply 1 application topically  2 (two) times daily. For 10 days 453.6 g 0  . venlafaxine XR (EFFEXOR-XR) 75 MG 24 hr capsule Take 1 capsule (75 mg total) by mouth daily with breakfast. 30 capsule 11    Allergies Allergies  Allergen Reactions  . Poison Oak Extract [Poison Oak Extract] Anaphylaxis, Swelling and Rash    Family History Family History  Problem Relation Age of Onset  . COPD Mother        Living  . Anxiety disorder Mother   . GER disease Mother   . Congestive Heart Failure Mother   . Sleep apnea Mother   . Arthritis Mother   . Kidney disease Mother   . Hypothyroidism Mother   . Hyperlipidemia Mother   . Pulmonary embolism Mother   . Anemia Mother   . Heart attack Father [phone]       Deceased  . Deep vein thrombosis Father   . Cancer Father [id number]       Ureteral  . Mental illness Sister     Review of Systems: Constitutional: no fevers or chills Eye:  no recent significant change in vision Ear/Nose/Mouth/Throat:  Ears:  no tinnitus or hearing loss Nose/Mouth/Throat:  no complaints of nasal congestion, no sore throat Cardiovascular:  no chest  pain, +presyncope Respiratory:  no cough and no shortness of breath Gastrointestinal:  no abdominal pain, no change in bowel habits GU:  Male: negative for dysuria, and incontinence; +freq Musculoskeletal/Extremities:  no pain, redness, or swelling of the joints Integumentary (Skin/Breast):  no abnormal skin lesions reported Neurologic:  no headaches, no numbness, tingling Endocrine: +wt gain Hematologic/Lymphatic:  no night sweats  Exam BP 118/80 (BP Location: Left Arm, Patient Position: Sitting, Cuff Size: Large)   Pulse 90   Temp 98.7 F (37.1 C) (Oral)   Ht 5\' 10"  (1.778 m)   Wt (!) 329 lb 6 oz (149.4 kg)   SpO2 96%   BMI 47.26 kg/m  General:  well developed, well nourished, in no apparent distress Skin:  no significant moles, warts, or growths Head:  no masses, lesions, or tenderness Eyes:  pupils equal and round, sclera anicteric without  injection Ears:  canals without lesions, TMs shiny without retraction, no obvious effusion, no erythema Nose:  nares patent, septum midline, mucosa normal Throat/Pharynx:  lips and gingiva without lesion; tongue and uvula midline; non-inflamed pharynx; no exudates or postnasal drainage Neck: neck supple without adenopathy, thyromegaly, or masses Lungs:  clear to auscultation, breath sounds equal bilaterally, no respiratory distress Cardio:  regular rate and rhythm, no bruits, no LE edema Abdomen:  abdomen soft, nontender; bowel sounds normal; no masses or organomegaly Genital (male): circumcised penis, no lesions or discharge; testes present bilaterally without masses or tenderness Rectal: Deferred Musculoskeletal:  symmetrical muscle groups noted without atrophy or deformity Extremities:  no clubbing, cyanosis, or edema, no deformities, no skin discoloration Neuro:  gait normal; deep tendon reflexes normal and symmetric Psych: well oriented with normal range of affect and appropriate judgment/insight  Assessment and Plan  Well adult exam - Plan: Comprehensive metabolic panel, Lipid panel  Skin lesion - Plan: Ambulatory referral to Allergy  Benign prostatic hyperplasia (BPH) with urinary urgency - Plan: finasteride (PROSCAR) 5 MG tablet, DISCONTINUED: tamsulosin (FLOMAX) 0.4 MG CAPS capsule  Prostate cancer screening - Plan: PSA  Anxiety and depression - Plan: venlafaxine XR (EFFEXOR-XR) 37.5 MG 24 hr capsule   Well 47 y.o. male. Counseled on diet and exercise. Counseled on risks and benefits of prostate cancer screening with PSA.  The patient agrees to undergo screening.  For skin issue, refer to allergy team to see if there is a trigger. We will call in a new dose of his Effexor.  He is currently a 75 mg daily, will call in 37.5 mg daily for 2 weeks.  If he does well with this, will do every other day and finally stop.  If he does need to go back, he would prefer something weight  neutral like Prozac. Stop Flomax, start finasteride. Other orders as above. Follow up in 6 months pending the above workup.  He will need to return for fasting labs prior to this. The patient voiced understanding and agreement to the plan.  Jilda Roche [place], DO 11/11/17 11:30 AM

## 2017-11-14 ENCOUNTER — Other Ambulatory Visit: Payer: BLUE CROSS/BLUE SHIELD

## 2017-11-15 ENCOUNTER — Other Ambulatory Visit: Payer: Self-pay | Admitting: Family Medicine

## 2017-11-15 ENCOUNTER — Encounter: Payer: Self-pay | Admitting: Family Medicine

## 2017-11-15 ENCOUNTER — Other Ambulatory Visit (INDEPENDENT_AMBULATORY_CARE_PROVIDER_SITE_OTHER): Payer: BLUE CROSS/BLUE SHIELD

## 2017-11-15 DIAGNOSIS — E782 Mixed hyperlipidemia: Secondary | ICD-10-CM

## 2017-11-15 DIAGNOSIS — Z125 Encounter for screening for malignant neoplasm of prostate: Secondary | ICD-10-CM

## 2017-11-15 DIAGNOSIS — Z0000 Encounter for general adult medical examination without abnormal findings: Secondary | ICD-10-CM | POA: Diagnosis not present

## 2017-11-15 LAB — PSA: PSA: 0.72 ng/mL (ref 0.10–4.00)

## 2017-11-15 LAB — COMPREHENSIVE METABOLIC PANEL
ALBUMIN: 4.5 g/dL (ref 3.5–5.2)
ALT: 45 U/L (ref 0–53)
AST: 29 U/L (ref 0–37)
Alkaline Phosphatase: 76 U/L (ref 39–117)
BILIRUBIN TOTAL: 0.6 mg/dL (ref 0.2–1.2)
BUN: 13 mg/dL (ref 6–23)
CALCIUM: 9.3 mg/dL (ref 8.4–10.5)
CHLORIDE: 103 meq/L (ref 96–112)
CO2: 31 mEq/L (ref 19–32)
CREATININE: 1.06 mg/dL (ref 0.40–1.50)
GFR: 80 mL/min (ref >60.00)
Glucose, Bld: 100 mg/dL — ABNORMAL HIGH (ref 70–99)
Potassium: 4.5 mEq/L (ref 3.5–5.1)
Sodium: 140 mEq/L (ref 135–145)
TOTAL PROTEIN: 7.3 g/dL (ref 6.0–8.3)

## 2017-11-15 LAB — LIPID PANEL
CHOLESTEROL: 189 mg/dL (ref 0–200)
HDL: 30.7 mg/dL — ABNORMAL LOW (ref >39.00)
NonHDL: 158.05
Total CHOL/HDL Ratio: 6
Triglycerides: 301 mg/dL — ABNORMAL HIGH (ref 0.0–149.0)
VLDL: 60.2 mg/dL — AB (ref 0.0–40.0)

## 2017-11-15 LAB — LDL CHOLESTEROL, DIRECT: LDL DIRECT: 109 mg/dL

## 2017-11-16 ENCOUNTER — Other Ambulatory Visit (INDEPENDENT_AMBULATORY_CARE_PROVIDER_SITE_OTHER): Payer: BLUE CROSS/BLUE SHIELD

## 2017-11-16 DIAGNOSIS — Z0000 Encounter for general adult medical examination without abnormal findings: Secondary | ICD-10-CM

## 2017-11-16 LAB — TSH: TSH: 1.68 u[IU]/mL (ref 0.35–4.50)

## 2017-12-22 ENCOUNTER — Ambulatory Visit (INDEPENDENT_AMBULATORY_CARE_PROVIDER_SITE_OTHER): Payer: BLUE CROSS/BLUE SHIELD | Admitting: Allergy and Immunology

## 2017-12-22 ENCOUNTER — Encounter: Payer: Self-pay | Admitting: Allergy and Immunology

## 2017-12-22 VITALS — BP 130/78 | HR 82 | Temp 98.0°F | Resp 20 | Ht 71.0 in | Wt 328.0 lb

## 2017-12-22 DIAGNOSIS — J3089 Other allergic rhinitis: Secondary | ICD-10-CM | POA: Diagnosis not present

## 2017-12-22 DIAGNOSIS — R21 Rash and other nonspecific skin eruption: Secondary | ICD-10-CM | POA: Diagnosis not present

## 2017-12-22 DIAGNOSIS — H1013 Acute atopic conjunctivitis, bilateral: Secondary | ICD-10-CM

## 2017-12-22 DIAGNOSIS — L255 Unspecified contact dermatitis due to plants, except food: Secondary | ICD-10-CM | POA: Insufficient documentation

## 2017-12-22 DIAGNOSIS — L309 Dermatitis, unspecified: Secondary | ICD-10-CM | POA: Diagnosis not present

## 2017-12-22 DIAGNOSIS — H1010 Acute atopic conjunctivitis, unspecified eye: Secondary | ICD-10-CM | POA: Insufficient documentation

## 2017-12-22 MED ORDER — FLUTICASONE PROPIONATE 50 MCG/ACT NA SUSP: NASAL | 5 refills | Status: DC

## 2017-12-22 MED ORDER — OLOPATADINE HCL 0.2 % OP SOLN: 1.0000 [drp] | OPHTHALMIC | 5 refills | Status: DC

## 2017-12-22 MED ORDER — LEVOCETIRIZINE DIHYDROCHLORIDE 5 MG PO TABS: 5.0000 mg | ORAL_TABLET | Freq: Every day | ORAL | 5 refills | Status: DC | PRN

## 2017-12-22 NOTE — Assessment & Plan Note (Signed)
   Aeroallergen avoidance measures have been discussed and provided in written form.  A prescription has been provided for levocetirizine, 5 mg daily as needed.  A prescription has been provided for fluticasone nasal spray, one spray per nostril 1-2 times daily as needed. Proper nasal spray technique has been discussed and demonstrated.  Nasal saline spray (i.e., Simply Saline) or nasal saline lavage (i.e., NeilMed) is recommended as needed and prior to medicated nasal sprays. 

## 2017-12-22 NOTE — Assessment & Plan Note (Addendum)
Given his history of high reactivity to poison oak, he carefully avoids poison oak, ivy, and sumac.  However, given the timing of the onset of rash, during the summertime, and the fact that he has multiple cats and dogs which have access to the woods, I suspect that he is coming into contact with urushoil indirectly via his pets.  Attempt to limit the pets contact with poison ivy, poison sumac, and poison oak, if possible and/or have the animals washed on a regular basis by another family member.  If this problem persists or progresses, particularly throughout the remainder of the year, we will proceed with patch testing to rule out other forms of allergic contact dermatitis.  For symptom relief, a prescription has been provided for levocetirizine 5 mg daily as needed.  Calamine lotion may be helpful as well.

## 2017-12-22 NOTE — Assessment & Plan Note (Addendum)
   Treatment plan as outlined above for allergic rhinitis.  A prescription has been provided for Pataday, one drop per eye daily as needed. 

## 2017-12-22 NOTE — Patient Instructions (Addendum)
Contact dermatitis Given his history of high reactivity to poison oak, he carefully avoids poison oak, ivy, and sumac.  However, given the timing of the onset of rash, during the summertime, and the fact that he has multiple cats and dogs which have access to the woods, I suspect that he is coming into contact with urushoil indirectly via his pets.  Attempt to limit the pets contact with poison ivy, poison sumac, and poison oak, if possible and/or have the animals washed on a regular basis by another family member.  If this problem persists or progresses, particularly throughout the remainder of the year, we will proceed with patch testing to rule out other forms of allergic contact dermatitis.  For symptom relief, a prescription has been provided for levocetirizine 5 mg daily as needed.  Calamine lotion may be helpful as well.  Perennial and seasonal allergic rhinitis  Aeroallergen avoidance measures have been discussed and provided in written form.  A prescription has been provided for levocetirizine, 5 mg daily as needed.  A prescription has been provided for fluticasone nasal spray, one spray per nostril 1-2 times daily as needed. Proper nasal spray technique has been discussed and demonstrated.  Nasal saline spray (i.e., Simply Saline) or nasal saline lavage (i.e., NeilMed) is recommended as needed and prior to medicated nasal sprays.  Allergic conjunctivitis  Treatment plan as outlined above for allergic rhinitis.  A prescription has been provided for Pataday, one drop per eye daily as needed.   Return if symptoms worsen or fail to improve.  Reducing Pollen Exposure  The American Academy of Allergy, Asthma and Immunology suggests the following steps to reduce your exposure to pollen during allergy seasons.    1. Do not hang sheets or clothing out to dry; pollen may collect on these items. 2. Do not mow lawns or spend time around freshly cut grass; mowing stirs up  pollen. 3. Keep windows closed at night.  Keep car windows closed while driving. 4. Minimize morning activities outdoors, a time when pollen counts are usually at their highest. 5. Stay indoors as much as possible when pollen counts or humidity is high and on windy days when pollen tends to remain in the air longer. 6. Use air conditioning when possible.  Many air conditioners have filters that trap the pollen spores. 7. Use a HEPA room air filter to remove pollen form the indoor air you breathe.   Control of House Dust Mite Allergen  House dust mites play a major role in allergic asthma and rhinitis.  They occur in environments with high humidity wherever human skin, the food for dust mites is found. High levels have been detected in dust obtained from mattresses, pillows, carpets, upholstered furniture, bed covers, clothes and soft toys.  The principal allergen of the house dust mite is found in its feces.  A gram of dust may contain 1,000 mites and 250,000 fecal particles.  Mite antigen is easily measured in the air during house cleaning activities.    1. Encase mattresses, including the box spring, and pillow, in an air tight cover.  Seal the zipper end of the encased mattresses with wide adhesive tape. 2. Wash the bedding in water of 130 degrees Farenheit weekly.  Avoid cotton comforters/quilts and flannel bedding: the most ideal bed covering is the dacron comforter. 3. Remove all upholstered furniture from the bedroom. 4. Remove carpets, carpet padding, rugs, and non-washable window drapes from the bedroom.  Wash drapes weekly or use plastic window coverings.  5. Remove all non-washable stuffed toys from the bedroom.  Wash stuffed toys weekly. 6. Have the room cleaned frequently with a vacuum cleaner and a damp dust-mop.  The patient should not be in a room which is being cleaned and should wait 1 hour after cleaning before going into the room. 7. Close and seal all heating outlets in the  bedroom.  Otherwise, the room will become filled with dust-laden air.  An electric heater can be used to heat the room. 8. Reduce indoor humidity to less than 50%.  Do not use a humidifier.  Control of Mold Allergen  Mold and fungi can grow on a variety of surfaces provided certain temperature and moisture conditions exist.  Outdoor molds grow on plants, decaying vegetation and soil.  The major outdoor mold, Alternaria and Cladosporium, are found in very high numbers during hot and dry conditions.  Generally, a late Summer - Fall peak is seen for common outdoor fungal spores.  Rain will temporarily lower outdoor mold spore count, but counts rise rapidly when the rainy period ends.  The most important indoor molds are Aspergillus and Penicillium.  Dark, humid and poorly ventilated basements are ideal sites for mold growth.  The next most common sites of mold growth are the bathroom and the kitchen.  Outdoor MicrosoftMold Control 1. Use air conditioning and keep windows closed 2. Avoid exposure to decaying vegetation. 3. Avoid leaf raking. 4. Avoid grain handling. 5. Consider wearing a face mask if working in moldy areas.  Indoor Mold Control 1. Maintain humidity below 50%. 2. Clean washable surfaces with 5% bleach solution. 3. Remove sources e.g. Contaminated carpets.

## 2017-12-22 NOTE — Progress Notes (Signed)
New Patient Note  RE: Stuart Taylor MRN: [id number] DOB: 23-Dec-1970 Date of Office Visit: 12/22/2017  Referring provider: Sharlene Dory* Primary care provider: Sharlene Dory, DO  Chief Complaint: Rash   History of present illness: Stuart Taylor is a 47 y.o. male seen today in consultation requested by Sharlene Dory, DO.  He complains of a rash on his legs that has been a problem for him over the past 3 or 4 years.  He reports that the rash starts out as small, clustered blisters filled with clear fluid.  The rash is exquisitely pruritic.  He reports that the rash develops in the early summer and tends to fade away in the fall.  He states that he has severe reaction to poison oak "if I even go near it" so he avoids poison ivy, poison sumac, or poison oak.  No specific medication, food, skin care product, detergent, soap, or other environmental triggers have been identified. He occasionally experiences nasal congestion, rhinorrhea, sneezing, postnasal drainage, ocular pruritus, and sinus headaches.  No significant seasonal symptom variation has been noted nor have specific environmental triggers been identified.  He reports that when he experiences the symptoms he does not take medication but typically goes to bed.  Assessment and plan: Contact dermatitis Given his history of high reactivity to poison oak, he carefully avoids poison oak, ivy, and sumac.  However, given the timing of the onset of rash, during the summertime, and the fact that he has multiple cats and dogs which have access to the woods, I suspect that he is coming into contact with urushoil indirectly via his pets.  Attempt to limit the pets contact with poison ivy, poison sumac, and poison oak, if possible and/or have the animals washed on a regular basis by another family member.  If this problem persists or progresses, particularly throughout the remainder of the year, we will proceed with  patch testing to rule out other forms of allergic contact dermatitis.  For symptom relief, a prescription has been provided for levocetirizine 5 mg daily as needed.  Calamine lotion may be helpful as well.  Perennial and seasonal allergic rhinitis  Aeroallergen avoidance measures have been discussed and provided in written form.  A prescription has been provided for levocetirizine, 5 mg daily as needed.  A prescription has been provided for fluticasone nasal spray, one spray per nostril 1-2 times daily as needed. Proper nasal spray technique has been discussed and demonstrated.  Nasal saline spray (i.e., Simply Saline) or nasal saline lavage (i.e., NeilMed) is recommended as needed and prior to medicated nasal sprays.  Allergic conjunctivitis  Treatment plan as outlined above for allergic rhinitis.  A prescription has been provided for Pataday, one drop per eye daily as needed.   Meds ordered this encounter  Medications  . levocetirizine (XYZAL) 5 MG tablet    Sig: Take 1 tablet (5 mg total) by mouth daily as needed.    Dispense:  30 tablet    Refill:  5  . fluticasone (FLONASE) 50 MCG/ACT nasal spray    Sig: Use 1 spray per nostril 1-2 times daily as needed    Dispense:  16 g    Refill:  5  . Olopatadine HCl (PATADAY) 0.2 % SOLN    Sig: Place 1 drop into both eyes 1 day or 1 dose.    Dispense:  1 Bottle    Refill:  5    Diagnostics: Environmental skin testing: Positive to grass pollen,  mold, and dust mite antigen. Food allergen skin testing: Negative despite a positive histamine control.    Physical examination: Blood pressure 130/78, pulse 82, temperature 98 F (36.7 C), temperature source Oral, resp. rate 20, height 5\' 11"  (1.803 m), weight (!) 328 lb (148.8 kg), SpO2 95 %.  General: Alert, interactive, in no acute distress. HEENT: TMs pearly gray, turbinates moderately edematous with thick discharge, post-pharynx erythematous. Neck: Supple without  lymphadenopathy. Lungs: Clear to auscultation without wheezing, rhonchi or rales. CV: Normal S1, S2 without murmurs. Abdomen: Nondistended, nontender. Skin: Erythematous patch on the right lower extremity. Extremities:  No clubbing, cyanosis or edema. Neuro:   Grossly intact.  Review of systems:  Review of systems negative except as noted in HPI / PMHx or noted below: Review of Systems  Constitutional: Negative.   HENT: Negative.   Eyes: Negative.   Respiratory: Negative.   Cardiovascular: Negative.   Gastrointestinal: Negative.   Genitourinary: Negative.   Musculoskeletal: Negative.   Skin: Negative.   Neurological: Negative.   Endo/Heme/Allergies: Negative.   Psychiatric/Behavioral: Negative.     Past medical history:  Past Medical History:  Diagnosis Date  . Anxiety and depression   . History of chicken pox     Past surgical history:  Past Surgical History:  Procedure Laterality Date  . APPENDECTOMY  11/2016  . EYE SURGERY     x4, [3]Left, [2] Right  . I&D EXTREMITY Left 12/16/2014   Procedure: IRRIGATION AND DEBRIDEMENT EXTREMITY;  Surgeon: Bradly BienenstockFred Ortmann, MD;  Location: [facility] OR;  Service: Orthopedics;  Laterality: Left;  . TONSILLECTOMY      Family history: Family History  Problem Relation Age of Onset  . COPD Mother        Living  . Anxiety disorder Mother   . GER disease Mother   . Congestive Heart Failure Mother   . Sleep apnea Mother   . Arthritis Mother   . Kidney disease Mother   . Hypothyroidism Mother   . Hyperlipidemia Mother   . Pulmonary embolism Mother   . Anemia Mother   . Asthma Mother   . Heart attack Father [phone]       Deceased  . Deep vein thrombosis Father   . Cancer Father [id number]       Ureteral  . Allergic rhinitis Father   . Mental illness Sister   . Eczema Neg Hx   . Urticaria Neg Hx   . Immunodeficiency Neg Hx   . Angioedema Neg Hx     Social history: Social History   Socioeconomic History  . Marital status: Single    Spouse  name: Not on file  . Number of children: Not on file  . Years of education: Not on file  . Highest education level: Not on file  Occupational History  . Not on file  Social Needs  . Financial resource strain: Not on file  . Food insecurity:    Worry: Not on file    Inability: Not on file  . Transportation needs:    Medical: Not on file    Non-medical: Not on file  Tobacco Use  . Smoking status: Former Smoker    Packs/day: 0.50    Years: 7.00    Pack years: 3.50    Types: Cigarettes    Last attempt to quit: 04/19/2016    Years since quitting: 1.6  . Smokeless tobacco: Never Used  Substance and Sexual Activity  . Alcohol use: No    Alcohol/week: 0.0 standard drinks  .  Drug use: No  . Sexual activity: Not on file  Lifestyle  . Physical activity:    Days per week: Not on file    Minutes per session: Not on file  . Stress: Not on file  Relationships  . Social connections:    Talks on phone: Not on file    Gets together: Not on file    Attends religious service: Not on file    Active member of club or organization: Not on file    Attends meetings of clubs or organizations: Not on file    Relationship status: Not on file  . Intimate partner violence:    Fear of current or ex partner: Not on file    Emotionally abused: Not on file    Physically abused: Not on file    Forced sexual activity: Not on file  Other Topics Concern  . Not on file  Social History Narrative  . Not on file   Environmental History: The patient lives in 46 year old house with carpeting throughout and central air/heat.  There are cats, dogs, and ferrets in the home, the dogs have access to his bedroom.  He is a former cigarette smoker having started in 2012 and quit in 2017, averaging 1 pack/day during that time.  There is no known mold/water damage in the home.  Allergies as of 12/22/2017      Reactions   Poison Oak Extract [poison Oak Extract] Anaphylaxis, Swelling, Rash      Medication List         Accurate as of 12/22/17  1:23 PM. Always use your most recent med list.          finasteride 5 MG tablet Commonly known as:  PROSCAR Take 1 tablet (5 mg total) by mouth daily.   fluticasone 50 MCG/ACT nasal spray Commonly known as:  FLONASE Use 1 spray per nostril 1-2 times daily as needed   levocetirizine 5 MG tablet Commonly known as:  XYZAL Take 1 tablet (5 mg total) by mouth daily as needed.   Olopatadine HCl 0.2 % Soln Place 1 drop into both eyes 1 day or 1 dose.   pyridOXINE 25 MG tablet Commonly known as:  VITAMIN B-6 Take 25 mg by mouth daily.   venlafaxine XR 37.5 MG 24 hr capsule Commonly known as:  EFFEXOR-XR Take 1 capsule (37.5 mg total) by mouth daily with breakfast.       Known medication allergies: Allergies  Allergen Reactions  . Poison Oak Extract [Poison Oak Extract] Anaphylaxis, Swelling and Rash    I appreciate the opportunity to take part in [place] care. Please do not hesitate to contact me with questions.  Sincerely,   R. Jorene Guest, MD

## 2018-01-13 ENCOUNTER — Encounter: Payer: Self-pay | Admitting: Family Medicine

## 2018-02-15 ENCOUNTER — Other Ambulatory Visit: Payer: BLUE CROSS/BLUE SHIELD

## 2018-05-15 ENCOUNTER — Ambulatory Visit: Payer: BLUE CROSS/BLUE SHIELD | Admitting: Family Medicine

## 2019-07-11 ENCOUNTER — Other Ambulatory Visit: Payer: Self-pay

## 2019-07-12 ENCOUNTER — Ambulatory Visit (INDEPENDENT_AMBULATORY_CARE_PROVIDER_SITE_OTHER): Payer: 59 | Admitting: Family Medicine

## 2019-07-12 ENCOUNTER — Encounter: Payer: Self-pay | Admitting: Family Medicine

## 2019-07-12 VITALS — BP 118/78 | HR 81 | Temp 97.1°F | Ht 71.0 in | Wt 276.6 lb

## 2019-07-12 DIAGNOSIS — F32A Depression, unspecified: Secondary | ICD-10-CM

## 2019-07-12 DIAGNOSIS — F419 Anxiety disorder, unspecified: Secondary | ICD-10-CM | POA: Diagnosis not present

## 2019-07-12 DIAGNOSIS — Z0000 Encounter for general adult medical examination without abnormal findings: Secondary | ICD-10-CM | POA: Insufficient documentation

## 2019-07-12 DIAGNOSIS — F329 Major depressive disorder, single episode, unspecified: Secondary | ICD-10-CM

## 2019-07-12 DIAGNOSIS — Z23 Encounter for immunization: Secondary | ICD-10-CM

## 2019-07-12 LAB — COMPREHENSIVE METABOLIC PANEL
ALT: 34 U/L (ref 0–53)
AST: 25 U/L (ref 0–37)
Albumin: 4.4 g/dL (ref 3.5–5.2)
Alkaline Phosphatase: 44 U/L (ref 39–117)
BUN: 16 mg/dL (ref 6–23)
CO2: 26 mEq/L (ref 19–32)
Calcium: 9.4 mg/dL (ref 8.4–10.5)
Chloride: 104 mEq/L (ref 96–112)
Creatinine, Ser: 1.18 mg/dL (ref 0.40–1.50)
GFR: 66.03 mL/min (ref >60.00)
Glucose, Bld: 77 mg/dL (ref 70–99)
Potassium: 4.5 mEq/L (ref 3.5–5.1)
Sodium: 137 mEq/L (ref 135–145)
Total Bilirubin: 0.6 mg/dL (ref 0.2–1.2)
Total Protein: 7.2 g/dL (ref 6.0–8.3)

## 2019-07-12 LAB — URINALYSIS, ROUTINE W REFLEX MICROSCOPIC
Bilirubin Urine: NEGATIVE
Hgb urine dipstick: NEGATIVE
Leukocytes,Ua: NEGATIVE
Nitrite: NEGATIVE
RBC / HPF: NONE SEEN (ref 0–?)
Specific Gravity, Urine: 1.015 (ref 1.000–1.030)
Total Protein, Urine: NEGATIVE
Urine Glucose: NEGATIVE
Urobilinogen, UA: 0.2 (ref 0.0–1.0)
pH: 6 (ref 5.0–8.0)

## 2019-07-12 LAB — CBC
HCT: 43.6 % (ref 39.0–52.0)
Hemoglobin: 15 g/dL (ref 13.0–17.0)
MCHC: 34.3 g/dL (ref 30.0–36.0)
MCV: 88.3 fl (ref 78.0–100.0)
Platelets: 169 10*3/uL (ref 150.0–400.0)
RBC: 4.94 Mil/uL (ref 4.22–5.81)
RDW: 13.3 % (ref 11.5–15.5)
WBC: 3.6 10*3/uL — ABNORMAL LOW (ref 4.0–10.5)

## 2019-07-12 LAB — LIPID PANEL
Cholesterol: 157 mg/dL (ref 0–200)
HDL: 30 mg/dL — ABNORMAL LOW (ref >39.00)
LDL Cholesterol: 101 mg/dL — ABNORMAL HIGH (ref 0–99)
NonHDL: 127.22
Total CHOL/HDL Ratio: 5
Triglycerides: 129 mg/dL (ref 0.0–149.0)
VLDL: 25.8 mg/dL (ref 0.0–40.0)

## 2019-07-12 LAB — LDL CHOLESTEROL, DIRECT: Direct LDL: 111 mg/dL

## 2019-07-12 LAB — VITAMIN D 25 HYDROXY (VIT D DEFICIENCY, FRACTURES): VITD: 32.87 ng/mL (ref 30.00–100.00)

## 2019-07-12 MED ORDER — BUPROPION HCL ER (XL) 300 MG PO TB24: 300.0000 mg | ORAL_TABLET | Freq: Every day | ORAL | 1 refills | Status: DC

## 2019-07-12 NOTE — Patient Instructions (Signed)
Health Maintenance, Male Adopting a healthy lifestyle and getting preventive care are important in promoting health and wellness. Ask your health care provider about:  The right schedule for you to have regular tests and exams.  Things you can do on your own to prevent diseases and keep yourself healthy. What should I know about diet, weight, and exercise? Eat a healthy diet   Eat a diet that includes plenty of vegetables, fruits, low-fat dairy products, and lean protein.  Do not eat a lot of foods that are high in solid fats, added sugars, or sodium. Maintain a healthy weight Body mass index (BMI) is a measurement that can be used to identify possible weight problems. It estimates body fat based on height and weight. Your health care provider can help determine your BMI and help you achieve or maintain a healthy weight. Get regular exercise Get regular exercise. This is one of the most important things you can do for your health. Most adults should:  Exercise for at least 150 minutes each week. The exercise should increase your heart rate and make you sweat (moderate-intensity exercise).  Do strengthening exercises at least twice a week. This is in addition to the moderate-intensity exercise.  Spend less time sitting. Even light physical activity can be beneficial. Watch cholesterol and blood lipids Have your blood tested for lipids and cholesterol at 48 years of age, then have this test every 5 years. You may need to have your cholesterol levels checked more often if:  Your lipid or cholesterol levels are high.  You are older than 48 years of age.  You are at high risk for heart disease. What should I know about cancer screening? Many types of cancers can be detected early and may often be prevented. Depending on your health history and family history, you may need to have cancer screening at various ages. This may include screening for:  Colorectal cancer.  Prostate  cancer.  Skin cancer.  Lung cancer. What should I know about heart disease, diabetes, and high blood pressure? Blood pressure and heart disease  High blood pressure causes heart disease and increases the risk of stroke. This is more likely to develop in people who have high blood pressure readings, are of African descent, or are overweight.  Talk with your health care provider about your target blood pressure readings.  Have your blood pressure checked: ? Every 3-5 years if you are 18-39 years of age. ? Every year if you are 48 years old or older.  If you are between the ages of 65 and 75 and are a current or former smoker, ask your health care provider if you should have a one-time screening for abdominal aortic aneurysm (AAA). Diabetes Have regular diabetes screenings. This checks your fasting blood sugar level. Have the screening done:  Once every three years after age 45 if you are at a normal weight and have a low risk for diabetes.  More often and at a younger age if you are overweight or have a high risk for diabetes. What should I know about preventing infection? Hepatitis B If you have a higher risk for hepatitis B, you should be screened for this virus. Talk with your health care provider to find out if you are at risk for hepatitis B infection. Hepatitis C Blood testing is recommended for:  Everyone born from 1945 through 1965.  Anyone with known risk factors for hepatitis C. Sexually transmitted infections (STIs)  You should be screened each year   for STIs, including gonorrhea and chlamydia, if: ? You are sexually active and are younger than 48 years of age. ? You are older than 48 years of age and your health care provider tells you that you are at risk for this type of infection. ? Your sexual activity has changed since you were last screened, and you are at increased risk for chlamydia or gonorrhea. Ask your health care provider if you are at risk.  Ask your  health care provider about whether you are at high risk for HIV. Your health care provider may recommend a prescription medicine to help prevent HIV infection. If you choose to take medicine to prevent HIV, you should first get tested for HIV. You should then be tested every 3 months for as long as you are taking the medicine. Follow these instructions at home: Lifestyle  Do not use any products that contain nicotine or tobacco, such as cigarettes, e-cigarettes, and chewing tobacco. If you need help quitting, ask your health care provider.  Do not use street drugs.  Do not share needles.  Ask your health care provider for help if you need support or information about quitting drugs. Alcohol use  Do not drink alcohol if your health care provider tells you not to drink.  If you drink alcohol: ? Limit how much you have to 0-2 drinks a day. ? Be aware of how much alcohol is in your drink. In the [place] one drink equals one 12 oz bottle of beer (355 mL), one 5 oz glass of wine (148 mL), or one 1 oz glass of hard liquor (44 mL). General instructions  Schedule regular health, dental, and eye exams.  Stay current with your vaccines.  Tell your health care provider if: ? You often feel depressed. ? You have ever been abused or do not feel safe at home. Summary  Adopting a healthy lifestyle and getting preventive care are important in promoting health and wellness.  Follow your health care provider's instructions about healthy diet, exercising, and getting tested or screened for diseases.  Follow your health care provider's instructions on monitoring your cholesterol and blood pressure. This information is not intended to replace advice given to you by your health care provider. Make sure you discuss any questions you have with your health care provider. Document Revised: 04/26/2018 Document Reviewed: 04/26/2018 Elsevier Patient Education  2020 [place] With  Depression Everyone experiences occasional disappointment, sadness, and loss in their lives. When you are feeling down, blue, or sad for at least 2 weeks in a row, it may mean that you have depression. Depression can affect your thoughts and feelings, relationships, daily activities, and physical health. It is caused by changes in the way your brain functions. If you receive a diagnosis of depression, your health care provider will tell you which type of depression you have and what treatment options are available to you. If you are living with depression, there are ways to help you recover from it and also ways to prevent it from coming back. How to cope with lifestyle changes Coping with stress     Stress is your body's reaction to life changes and events, both good and bad. Stressful situations may include:  Getting married.  The death of a spouse.  Losing a job.  Retiring.  Having a baby. Stress can last just a few hours or it can be ongoing. Stress can play a major role in depression, so it is important to learn  both how to cope with stress and how to think about it differently. Talk with your health care provider or a counselor if you would like to learn more about stress reduction. He or she may suggest some stress reduction techniques, such as:  Music therapy. This can include creating music or listening to music. Choose music that you enjoy and that inspires you.  Mindfulness-based meditation. This kind of meditation can be done while sitting or walking. It involves being aware of your normal breaths, rather than trying to control your breathing.  Centering prayer. This is a kind of meditation that involves focusing on a spiritual word or phrase. Choose a word, phrase, or sacred image that is meaningful to you and that brings you peace.  Deep breathing. To do this, expand your stomach and inhale slowly through your nose. Hold your breath for 3-5 seconds, then exhale slowly,  allowing your stomach muscles to relax.  Muscle relaxation. This involves intentionally tensing muscles then relaxing them. Choose a stress reduction technique that fits your lifestyle and personality. Stress reduction techniques take time and practice to develop. Set aside 5-15 minutes a day to do them. Therapists can offer training in these techniques. The training may be covered by some insurance plans. Other things you can do to manage stress include:  Keeping a stress diary. This can help you learn what triggers your stress and ways to control your response.  Understanding what your limits are and saying no to requests or events that lead to a schedule that is too full.  Thinking about how you respond to certain situations. You may not be able to control everything, but you can control how you react.  Adding humor to your life by watching funny films or TV shows.  Making time for activities that help you relax and not feeling guilty about spending your time this way.  Medicines Your health care provider may suggest certain medicines if he or she feels that they will help improve your condition. Avoid using alcohol and other substances that may prevent your medicines from working properly (may interact). It is also important to:  Talk with your pharmacist or health care provider about all the medicines that you take, their possible side effects, and what medicines are safe to take together.  Make it your goal to take part in all treatment decisions (shared decision-making). This includes giving input on the side effects of medicines. It is best if shared decision-making with your health care provider is part of your total treatment plan. If your health care provider prescribes a medicine, you may not notice the full benefits of it for 4-8 weeks. Most people who are treated for depression need to be on medicine for at least 6-12 months after they feel better. If you are taking medicines as  part of your treatment, do not stop taking medicines without first talking to your health care provider. You may need to have the medicine slowly decreased (tapered) over time to decrease the risk of harmful side effects. Relationships Your health care provider may suggest family therapy along with individual therapy and drug therapy. While there may not be family problems that are causing you to feel depressed, it is still important to make sure your family learns as much as they can about your mental health. Having your family's support can help make your treatment successful. How to recognize changes in your condition Everyone has a different response to treatment for depression. Recovery from major depression happens  when you have not had signs of major depression for two months. This may mean that you will start to:  Have more interest in doing activities.  Feel less hopeless than you did 2 months ago.  Have more energy.  Overeat less often, or have better or improving appetite.  Have better concentration. Your health care provider will work with you to decide the next steps in your recovery. It is also important to recognize when your condition is getting worse. Watch for these signs:  Having fatigue or low energy.  Eating too much or too little.  Sleeping too much or too little.  Feeling restless, agitated, or hopeless.  Having trouble concentrating or making decisions.  Having unexplained physical complaints.  Feeling irritable, angry, or aggressive. Get help as soon as you or your family members notice these symptoms coming back. How to get support and help from others How to talk with friends and family members about your condition  Talking to friends and family members about your condition can provide you with one way to get support and guidance. Reach out to trusted friends or family members, explain your symptoms to them, and let them know that you are working with a  health care provider to treat your depression. Financial resources Not all insurance plans cover mental health care, so it is important to check with your insurance carrier. If paying for co-pays or counseling services is a problem, search for a local or county mental health care center. They may be able to offer public mental health care services at low or no cost when you are not able to see a private health care provider. If you are taking medicine for depression, you may be able to get the generic form, which may be less expensive. Some makers of prescription medicines also offer help to patients who cannot afford the medicines they need. Follow these instructions at home:   Get the right amount and quality of sleep.  Cut down on using caffeine, tobacco, alcohol, and other potentially harmful substances.  Try to exercise, such as walking or lifting small weights.  Take over-the-counter and prescription medicines only as told by your health care provider.  Eat a healthy diet that includes plenty of vegetables, fruits, whole grains, low-fat dairy products, and lean protein. Do not eat a lot of foods that are high in solid fats, added sugars, or salt.  Keep all follow-up visits as told by your health care provider. This is important. Contact a health care provider if:  You stop taking your antidepressant medicines, and you have any of these symptoms: ? Nausea. ? Headache. ? Feeling lightheaded. ? Chills and body aches. ? Not being able to sleep (insomnia).  You or your friends and family think your depression is getting worse. Get help right away if:  You have thoughts of hurting yourself or others. If you ever feel like you may hurt yourself or others, or have thoughts about taking your own life, get help right away. You can go to your nearest emergency department or call:  Your local emergency services (911 in the [place]  A suicide crisis helpline, such as the [place] at 680-558-4842. This is open 24-hours a day. Summary  If you are living with depression, there are ways to help you recover from it and also ways to prevent it from coming back.  Work with your health care team to create a management plan that includes counseling, stress management techniques,  and healthy lifestyle habits. This information is not intended to replace advice given to you by your health care provider. Make sure you discuss any questions you have with your health care provider. Document Revised: 08/25/2018 Document Reviewed: 04/05/2016 Elsevier Patient Education  [place].  Obesity, Adult Obesity is the condition of having too much total body fat. Being overweight or obese means that your weight is greater than what is considered healthy for your body size. Obesity is determined by a measurement called BMI. BMI is an estimate of body fat and is calculated from height and weight. For adults, a BMI of 30 or higher is considered obese. Obesity can lead to other health concerns and major illnesses, including:  Stroke.  Coronary artery disease (CAD).  Type 2 diabetes.  Some types of cancer, including cancers of the colon, breast, uterus, and gallbladder.  Osteoarthritis.  High blood pressure (hypertension).  High cholesterol.  Sleep apnea.  Gallbladder stones.  Infertility problems. What are the causes? Common causes of this condition include:  Eating daily meals that are high in calories, sugar, and fat.  Being born with genes that may make you more likely to become obese.  Having a medical condition that causes obesity, including: ? Hypothyroidism. ? Polycystic ovarian syndrome (PCOS). ? Binge-eating disorder. ? Cushing syndrome.  Taking certain medicines, such as steroids, antidepressants, and seizure medicines.  Not being physically active (sedentary lifestyle).  Not getting enough sleep.  Drinking high amounts of  sugar-sweetened beverages, such as soft drinks. What increases the risk? The following factors may make you more likely to develop this condition:  Having a family history of obesity.  Being a woman of African American descent.  Being a man of Hispanic descent.  Living in an area with limited access to: ? [place]ilda [place], recreation centers, or sidewalks. ? Healthy food choices, such as grocery stores and farmers' markets. What are the signs or symptoms? The main sign of this condition is having too much body fat. How is this diagnosed? This condition is diagnosed based on:  Your BMI. If you are an adult with a BMI of 30 or higher, you are considered obese.  Your waist circumference. This measures the distance around your waistline.  Your skinfold thickness. Your health care provider may gently pinch a fold of your skin and measure it. You may have other tests to check for underlying conditions. How is this treated? Treatment for this condition often includes changing your lifestyle. Treatment may include some or all of the following:  Dietary changes. This may include developing a healthy meal plan.  Regular physical activity. This may include activity that causes your heart to beat faster (aerobic exercise) and strength training. Work with your health care provider to design an exercise program that works for you.  Medicine to help you lose weight if you are unable to lose 1 pound a week after 6 weeks of healthy eating and more physical activity.  Treating conditions that cause the obesity (underlying conditions).  Surgery. Surgical options may include gastric banding and gastric bypass. Surgery may be done if: ? Other treatments have not helped to improve your condition. ? You have a BMI of 40 or higher. ? You have life-threatening health problems related to obesity. Follow these instructions at home: Eating and drinking   Follow recommendations from your health care provider  about what you eat and drink. Your health care provider may advise you to: ? Limit fast food, sweets, and processed snack foods. ?  Choose low-fat options, such as low-fat milk instead of whole milk. ? Eat 5 or more servings of fruits or vegetables every day. ? Eat at home more often. This gives you more control over what you eat. ? Choose healthy foods when you eat out. ? Learn to read food labels. This will help you understand how much food is considered 1 serving. ? Learn what a healthy serving size is. ? Keep low-fat snacks available. ? Limit sugary drinks, such as soda, fruit juice, sweetened iced tea, and flavored milk.  Drink enough water to keep your urine pale yellow.  Do not follow a fad diet. Fad diets can be unhealthy and even dangerous. Physical activity  Exercise regularly, as told by your health care provider. ? Most adults should get up to 150 minutes of moderate-intensity exercise every week. ? Ask your health care provider what types of exercise are safe for you and how often you should exercise.  Warm up and stretch before being active.  Cool down and stretch after being active.  Rest between periods of activity. Lifestyle  Work with your health care provider and a dietitian to set a weight-loss goal that is healthy and reasonable for you.  Limit your screen time.  Find ways to reward yourself that do not involve food.  Do not drink alcohol if: ? Your health care provider tells you not to drink. ? You are pregnant, may be pregnant, or are planning to become pregnant.  If you drink alcohol: ? Limit how much you use to:  0-1 drink a day for women.  0-2 drinks a day for men. ? Be aware of how much alcohol is in your drink. In the [place] one drink equals one 12 oz bottle of beer (355 mL), one 5 oz glass of wine (148 mL), or one 1 oz glass of hard liquor (44 mL). General instructions  Keep a weight-loss journal to keep track of the food you eat and how much  exercise you get.  Take over-the-counter and prescription medicines only as told by your health care provider.  Take vitamins and supplements only as told by your health care provider.  Consider joining a support group. Your health care provider may be able to recommend a support group.  Keep all follow-up visits as told by your health care provider. This is important. Contact a health care provider if:  You are unable to meet your weight loss goal after 6 weeks of dietary and lifestyle changes. Get help right away if you are having:  Trouble breathing.  Suicidal thoughts or behaviors. Summary  Obesity is the condition of having too much total body fat.  Being overweight or obese means that your weight is greater than what is considered healthy for your body size.  Work with your health care provider and a dietitian to set a weight-loss goal that is healthy and reasonable for you.  Exercise regularly, as told by your health care provider. Ask your health care provider what types of exercise are safe for you and how often you should exercise. This information is not intended to replace advice given to you by your health care provider. Make sure you discuss any questions you have with your health care provider. Document Revised: 01/05/2018 Document Reviewed: 01/05/2018 Elsevier Patient Education  2020 [employer]  Preventive Care 40-42 Years Old, Male Preventive care refers to lifestyle choices and visits with your health care provider that can promote health and wellness. This includes:  A  yearly physical exam. This is also called an annual well check.  Regular dental and eye exams.  Immunizations.  Screening for certain conditions.  Healthy lifestyle choices, such as eating a healthy diet, getting regular exercise, not using drugs or products that contain nicotine and tobacco, and limiting alcohol use. What can I expect for my preventive care visit? Physical exam Your  health care provider will check:  Height and weight. These may be used to calculate body mass index (BMI), which is a measurement that tells if you are at a healthy weight.  Heart rate and blood pressure.  Your skin for abnormal spots. Counseling Your health care provider may ask you questions about:  Alcohol, tobacco, and drug use.  Emotional well-being.  Home and relationship well-being.  Sexual activity.  Eating habits.  Work and work Statistician. What immunizations do I need?  Influenza (flu) vaccine  This is recommended every year. Tetanus, diphtheria, and pertussis (Tdap) vaccine  You may need a Td booster every 10 years. Varicella (chickenpox) vaccine  You may need this vaccine if you have not already been vaccinated. Zoster (shingles) vaccine  You may need this after age 61. Measles, mumps, and rubella (MMR) vaccine  You may need at least one dose of MMR if you were born in 1957 or later. You may also need a second dose. Pneumococcal conjugate (PCV13) vaccine  You may need this if you have certain conditions and were not previously vaccinated. Pneumococcal polysaccharide (PPSV23) vaccine  You may need one or two doses if you smoke cigarettes or if you have certain conditions. Meningococcal conjugate (MenACWY) vaccine  You may need this if you have certain conditions. Hepatitis A vaccine  You may need this if you have certain conditions or if you travel or work in places where you may be exposed to hepatitis A. Hepatitis B vaccine  You may need this if you have certain conditions or if you travel or work in places where you may be exposed to hepatitis B. Haemophilus influenzae type b (Hib) vaccine  You may need this if you have certain risk factors. Human papillomavirus (HPV) vaccine  If recommended by your health care provider, you may need three doses over 6 months. You may receive vaccines as individual doses or as more than one vaccine together in  one shot (combination vaccines). Talk with your health care provider about the risks and benefits of combination vaccines. What tests do I need? Blood tests  Lipid and cholesterol levels. These may be checked every 5 years, or more frequently if you are over 57 years old.  Hepatitis C test.  Hepatitis B test. Screening  Lung cancer screening. You may have this screening every year starting at age 33 if you have a 30-pack-year history of smoking and currently smoke or have quit within the past 15 years.  Prostate cancer screening. Recommendations will vary depending on your family history and other risks.  Colorectal cancer screening. All adults should have this screening starting at age 68 and continuing until age 30. Your health care provider may recommend screening at age 36 if you are at increased risk. You will have tests every 1-10 years, depending on your results and the type of screening test.  Diabetes screening. This is done by checking your blood sugar (glucose) after you have not eaten for a while (fasting). You may have this done every 1-3 years.  Sexually transmitted disease (STD) testing. Follow these instructions at home: Eating and drinking  Eat  a diet that includes fresh fruits and vegetables, whole grains, lean protein, and low-fat dairy products.  Take vitamin and mineral supplements as recommended by your health care provider.  Do not drink alcohol if your health care provider tells you not to drink.  If you drink alcohol: ? Limit how much you have to 0-2 drinks a day. ? Be aware of how much alcohol is in your drink. In the [place] one drink equals one 12 oz bottle of beer (355 mL), one 5 oz glass of wine (148 mL), or one 1 oz glass of hard liquor (44 mL). Lifestyle  Take daily care of your teeth and gums.  Stay active. Exercise for at least 30 minutes on 5 or more days each week.  Do not use any products that contain nicotine or tobacco, such as cigarettes,  e-cigarettes, and chewing tobacco. If you need help quitting, ask your health care provider.  If you are sexually active, practice safe sex. Use a condom or other form of protection to prevent STIs (sexually transmitted infections).  Talk with your health care provider about taking a low-dose aspirin every day starting at age 76. What's next?  Go to your health care provider once a year for a well check visit.  Ask your health care provider how often you should have your eyes and teeth checked.  Stay up to date on all vaccines. This information is not intended to replace advice given to you by your health care provider. Make sure you discuss any questions you have with your health care provider. Document Revised: 04/27/2018 Document Reviewed: 04/27/2018 Elsevier Patient Education  2020 [employer].

## 2019-07-12 NOTE — Progress Notes (Signed)
New Patient Office Visit  Subjective:  Patient ID: Stuart Taylor, male    DOB: [birth year]  Age: 49 y.o. MRN: [id number]  CC:  Chief Complaint  Patient presents with  . Establish Care    New patient, needing refills on medications.     HPI Stuart Taylor presents for establishment of care follow-up of his depression with anxiety and obesity.  He has taken Wellbutrin XL 150 mg daily over the last year or so with some improvement in his mood.  Or recently he has been feeling anxious and depressed.  He lives at home with his mother and her roommate.  He is a Physiological scientist.  He quit smoking 7 years ago.  He rarely drinks alcohol and does not use illicit drugs.  He is not exercising.  Longstanding history with obesity.  He is used phentermine intermittently with success.  Has actually been able to lose 75 pounds while taking this medication.  He has not been in a formal weight loss program.   Past Medical History:  Diagnosis Date  . Anxiety and depression   . History of chicken pox     Past Surgical History:  Procedure Laterality Date  . APPENDECTOMY  11/2016  . EYE SURGERY     x4, [3]Left, [2] Right  . HERNIA REPAIR    . I & D EXTREMITY Left 12/16/2014   Procedure: IRRIGATION AND DEBRIDEMENT EXTREMITY;  Surgeon: [name], MD;  Location: [facility];  Service: Orthopedics;  Laterality: Left;  . TONSILLECTOMY      Family History  Problem Relation Age of Onset  . COPD Mother        Living  . Anxiety disorder Mother   . GER disease Mother   . Congestive Heart Failure Mother   . Sleep apnea Mother   . Arthritis Mother   . Kidney disease Mother   . Hypothyroidism Mother   . Hyperlipidemia Mother   . Pulmonary embolism Mother   . Anemia Mother   . Asthma Mother   . Heart attack Father 33       Deceased  . Deep vein thrombosis Father   . Cancer Father 33       Ureteral  . Allergic rhinitis Father   . Mental illness Sister   . Eczema Neg Hx   . Urticaria Neg Hx   .  Immunodeficiency Neg Hx   . Angioedema Neg Hx     Social History   Socioeconomic History  . Marital status: Single    Spouse name: Not on file  . Number of children: Not on file  . Years of education: Not on file  . Highest education level: Not on file  Occupational History  . Not on file  Tobacco Use  . Smoking status: Former Smoker    Packs/day: 0.50    Years: 7.00    Pack years: 3.50    Types: Cigarettes    Quit date: 04/19/2016    Years since quitting: 3.2  . Smokeless tobacco: Never Used  Substance and Sexual Activity  . Alcohol use: Yes    Alcohol/week: 1.0 standard drinks    Types: 1 Glasses of wine per week    Comment: occa  . Drug use: No  . Sexual activity: Not Currently  Other Topics Concern  . Not on file  Social History Narrative  . Not on file   Social Determinants of Health   Financial Resource Strain:   . Difficulty of Paying  Living Expenses: Not on file  Food Insecurity:   . Worried About Programme researcher, broadcasting/film/video in the Last Year: Not on file  . Ran Out of Food in the Last Year: Not on file  Transportation Needs:   . Lack of Transportation (Medical): Not on file  . Lack of Transportation (Non-Medical): Not on file  Physical Activity:   . Days of Exercise per Week: Not on file  . Minutes of Exercise per Session: Not on file  Stress:   . Feeling of Stress : Not on file  Social Connections:   . Frequency of Communication with Friends and Family: Not on file  . Frequency of Social Gatherings with Friends and Family: Not on file  . Attends Religious Services: Not on file  . Active Member of Clubs or Organizations: Not on file  . Attends Banker Meetings: Not on file  . Marital Status: Not on file  Intimate Partner Violence:   . Fear of Current or Ex-Partner: Not on file  . Emotionally Abused: Not on file  . Physically Abused: Not on file  . Sexually Abused: Not on file    ROS Review of Systems  Constitutional: Negative for chills,  diaphoresis, fatigue, fever and unexpected weight change.  HENT: Negative.   Eyes: Negative for photophobia and visual disturbance.  Respiratory: Negative.   Cardiovascular: Negative.   Gastrointestinal: Negative.   Endocrine: Negative for polyphagia and polyuria.  Genitourinary: Negative for difficulty urinating, frequency and urgency.  Musculoskeletal: Negative.  Negative for gait problem and joint swelling.  Skin: Negative for pallor and rash.  Allergic/Immunologic: Negative for immunocompromised state.  Neurological: Negative for tremors and speech difficulty.  Psychiatric/Behavioral: Positive for dysphoric mood. The patient is nervous/anxious.     Objective:   Today's Vitals: BP 118/78   Pulse 81   Temp (!) 97.1 F (36.2 C) (Tympanic)   Ht 5\' 11"  (1.803 m)   Wt 276 lb 9.6 oz (125.5 kg)   SpO2 96%   BMI 38.58 kg/m   Physical Exam Vitals and nursing note reviewed.  Constitutional:      General: He is not in acute distress.    Appearance: Normal appearance. He is obese. He is not ill-appearing, toxic-appearing or diaphoretic.  HENT:     Head: Normocephalic and atraumatic.     Right Ear: Tympanic membrane, ear canal and external ear normal.     Left Ear: Tympanic membrane, ear canal and external ear normal.     Mouth/Throat:     Mouth: Mucous membranes are moist.     Pharynx: Oropharynx is clear. No oropharyngeal exudate or posterior oropharyngeal erythema.  Eyes:     General:        Right eye: No discharge.        Left eye: No discharge.     Extraocular Movements: Extraocular movements intact.     Conjunctiva/sclera: Conjunctivae normal.     Pupils: Pupils are equal, round, and reactive to light.  Cardiovascular:     Rate and Rhythm: Normal rate and regular rhythm.     Heart sounds: Normal heart sounds.  Pulmonary:     Effort: Pulmonary effort is normal.     Breath sounds: Normal breath sounds.  Musculoskeletal:     Cervical back: No rigidity or tenderness.   Lymphadenopathy:     Cervical: No cervical adenopathy.  Skin:    General: Skin is warm and dry.  Neurological:     Mental Status: He is alert and  oriented to person, place, and time.  Psychiatric:        Mood and Affect: Mood normal.        Behavior: Behavior normal.     Assessment & Plan:   Problem List Items Addressed This Visit      Other   Anxiety and depression - Primary   Relevant Medications   buPROPion (WELLBUTRIN XL) 300 MG 24 hr tablet   Morbid obesity (HCC)   Relevant Medications   phentermine (ADIPEX-P) 37.5 MG tablet   Other Relevant Orders   VITAMIN D 25 Hydroxy (Vit-D Deficiency, Fractures)   Healthcare maintenance   Relevant Orders   Comprehensive metabolic panel   CBC   LDL cholesterol, direct   Lipid panel   Urinalysis, Routine w reflex microscopic    Other Visit Diagnoses    Need for influenza vaccination       Relevant Orders   Flu Vaccine QUAD 6+ mos PF IM (Fluarix Quad PF) (Completed)      Outpatient Encounter Medications as of 07/12/2019  Medication Sig  . levocetirizine (XYZAL) 5 MG tablet Take 1 tablet (5 mg total) by mouth daily as needed.  . phentermine (ADIPEX-P) 37.5 MG tablet Take 37.5 mg by mouth daily before breakfast.  . pyridOXINE (VITAMIN B-6) 25 MG tablet Take 25 mg by mouth daily.  . [DISCONTINUED] buPROPion (WELLBUTRIN XL) 150 MG 24 hr tablet Take 150 mg by mouth daily.  Marland Kitchen buPROPion (WELLBUTRIN XL) 300 MG 24 hr tablet Take 1 tablet (300 mg total) by mouth daily.  . finasteride (PROSCAR) 5 MG tablet Take 1 tablet (5 mg total) by mouth daily.  . fluticasone (FLONASE) 50 MCG/ACT nasal spray Use 1 spray per nostril 1-2 times daily as needed (Patient not taking: Reported on 07/12/2019)  . Olopatadine HCl (PATADAY) 0.2 % SOLN Place 1 drop into both eyes 1 day or 1 dose. (Patient not taking: Reported on 07/12/2019)  . venlafaxine XR (EFFEXOR-XR) 37.5 MG 24 hr capsule Take 1 capsule (37.5 mg total) by mouth daily with breakfast. (Patient  not taking: Reported on 07/12/2019)   No facility-administered encounter medications on file as of 07/12/2019.    Follow-up: Return in about 1 month (around 08/09/2019).   Patient was given information on health maintenance and disease prevention and disease prevention.  Also given information on living with depression and obesity.Marland Kitchen He was also given information on obesity he was also given information on obesity and living with depression.  Continue phentermine for now.  This patient may benefit from weight loss management and talking therapy.  Mliss Sax, MD

## 2019-08-09 ENCOUNTER — Other Ambulatory Visit: Payer: Self-pay

## 2019-08-10 ENCOUNTER — Ambulatory Visit (INDEPENDENT_AMBULATORY_CARE_PROVIDER_SITE_OTHER): Payer: 59 | Admitting: Family Medicine

## 2019-08-10 ENCOUNTER — Encounter: Payer: Self-pay | Admitting: Family Medicine

## 2019-08-10 VITALS — BP 126/86 | HR 77 | Temp 97.4°F | Ht 71.0 in | Wt 259.4 lb

## 2019-08-10 DIAGNOSIS — F419 Anxiety disorder, unspecified: Secondary | ICD-10-CM

## 2019-08-10 DIAGNOSIS — F32A Depression, unspecified: Secondary | ICD-10-CM

## 2019-08-10 DIAGNOSIS — F329 Major depressive disorder, single episode, unspecified: Secondary | ICD-10-CM | POA: Diagnosis not present

## 2019-08-10 DIAGNOSIS — E559 Vitamin D deficiency, unspecified: Secondary | ICD-10-CM | POA: Diagnosis not present

## 2019-08-10 MED ORDER — VITAMIN D 50 MCG (2000 UT) PO CAPS: ORAL_CAPSULE | ORAL | 1 refills | Status: DC

## 2019-08-10 MED ORDER — PHENTERMINE HCL 37.5 MG PO TABS: 37.5000 mg | ORAL_TABLET | Freq: Every day | ORAL | 2 refills | Status: DC

## 2019-08-10 MED ORDER — BUPROPION HCL ER (XL) 300 MG PO TB24: 300.0000 mg | ORAL_TABLET | Freq: Every day | ORAL | 2 refills | Status: DC

## 2019-08-10 NOTE — Patient Instructions (Addendum)
Living With Depression Everyone experiences occasional disappointment, sadness, and loss in their lives. When you are feeling down, blue, or sad for at least 2 weeks in a row, it may mean that you have depression. Depression can affect your thoughts and feelings, relationships, daily activities, and physical health. It is caused by changes in the way your brain functions. If you receive a diagnosis of depression, your health care provider will tell you which type of depression you have and what treatment options are available to you. If you are living with depression, there are ways to help you recover from it and also ways to prevent it from coming back. How to cope with lifestyle changes Coping with stress     Stress is your body's reaction to life changes and events, both good and bad. Stressful situations may include:  Getting married.  The death of a spouse.  Losing a job.  Retiring.  Having a baby. Stress can last just a few hours or it can be ongoing. Stress can play a major role in depression, so it is important to learn both how to cope with stress and how to think about it differently. Talk with your health care provider or a counselor if you would like to learn more about stress reduction. He or she may suggest some stress reduction techniques, such as:  Music therapy. This can include creating music or listening to music. Choose music that you enjoy and that inspires you.  Mindfulness-based meditation. This kind of meditation can be done while sitting or walking. It involves being aware of your normal breaths, rather than trying to control your breathing.  Centering prayer. This is a kind of meditation that involves focusing on a spiritual word or phrase. Choose a word, phrase, or sacred image that is meaningful to you and that brings you peace.  Deep breathing. To do this, expand your stomach and inhale slowly through your nose. Hold your breath for 3-5 seconds, then exhale  slowly, allowing your stomach muscles to relax.  Muscle relaxation. This involves intentionally tensing muscles then relaxing them. Choose a stress reduction technique that fits your lifestyle and personality. Stress reduction techniques take time and practice to develop. Set aside 5-15 minutes a day to do them. Therapists can offer training in these techniques. The training may be covered by some insurance plans. Other things you can do to manage stress include:  Keeping a stress diary. This can help you learn what triggers your stress and ways to control your response.  Understanding what your limits are and saying no to requests or events that lead to a schedule that is too full.  Thinking about how you respond to certain situations. You may not be able to control everything, but you can control how you react.  Adding humor to your life by watching funny films or TV shows.  Making time for activities that help you relax and not feeling guilty about spending your time this way.  Medicines Your health care provider may suggest certain medicines if he or she feels that they will help improve your condition. Avoid using alcohol and other substances that may prevent your medicines from working properly (may interact). It is also important to:  Talk with your pharmacist or health care provider about all the medicines that you take, their possible side effects, and what medicines are safe to take together.  Make it your goal to take part in all treatment decisions (shared decision-making). This includes giving input on   the side effects of medicines. It is best if shared decision-making with your health care provider is part of your total treatment plan. If your health care provider prescribes a medicine, you may not notice the full benefits of it for 4-8 weeks. Most people who are treated for depression need to be on medicine for at least 6-12 months after they feel better. If you are taking  medicines as part of your treatment, do not stop taking medicines without first talking to your health care provider. You may need to have the medicine slowly decreased (tapered) over time to decrease the risk of harmful side effects. Relationships Your health care provider may suggest family therapy along with individual therapy and drug therapy. While there may not be family problems that are causing you to feel depressed, it is still important to make sure your family learns as much as they can about your mental health. Having your family's support can help make your treatment successful. How to recognize changes in your condition Everyone has a different response to treatment for depression. Recovery from major depression happens when you have not had signs of major depression for two months. This may mean that you will start to:  Have more interest in doing activities.  Feel less hopeless than you did 2 months ago.  Have more energy.  Overeat less often, or have better or improving appetite.  Have better concentration. Your health care provider will work with you to decide the next steps in your recovery. It is also important to recognize when your condition is getting worse. Watch for these signs:  Having fatigue or low energy.  Eating too much or too little.  Sleeping too much or too little.  Feeling restless, agitated, or hopeless.  Having trouble concentrating or making decisions.  Having unexplained physical complaints.  Feeling irritable, angry, or aggressive. Get help as soon as you or your family members notice these symptoms coming back. How to get support and help from others How to talk with friends and family members about your condition  Talking to friends and family members about your condition can provide you with one way to get support and guidance. Reach out to trusted friends or family members, explain your symptoms to them, and let them know that you are  working with a health care provider to treat your depression. Financial resources Not all insurance plans cover mental health care, so it is important to check with your insurance carrier. If paying for co-pays or counseling services is a problem, search for a local or county mental health care center. They may be able to offer public mental health care services at low or no cost when you are not able to see a private health care provider. If you are taking medicine for depression, you may be able to get the generic form, which may be less expensive. Some makers of prescription medicines also offer help to patients who cannot afford the medicines they need. Follow these instructions at home:   Get the right amount and quality of sleep.  Cut down on using caffeine, tobacco, alcohol, and other potentially harmful substances.  Try to exercise, such as walking or lifting small weights.  Take over-the-counter and prescription medicines only as told by your health care provider.  Eat a healthy diet that includes plenty of vegetables, fruits, whole grains, low-fat dairy products, and lean protein. Do not eat a lot of foods that are high in solid fats, added sugars, or salt.    Keep all follow-up visits as told by your health care provider. This is important. Contact a health care provider if:  You stop taking your antidepressant medicines, and you have any of these symptoms: ? Nausea. ? Headache. ? Feeling lightheaded. ? Chills and body aches. ? Not being able to sleep (insomnia).  You or your friends and family think your depression is getting worse. Get help right away if:  You have thoughts of hurting yourself or others. If you ever feel like you may hurt yourself or others, or have thoughts about taking your own life, get help right away. You can go to your nearest emergency department or call:  Your local emergency services (911 in the [place]  A suicide crisis helpline, such as the  National Suicide Prevention Lifeline at 1-800-273-8255. This is open 24-hours a day. Summary  If you are living with depression, there are ways to help you recover from it and also ways to prevent it from coming back.  Work with your health care team to create a management plan that includes counseling, stress management techniques, and healthy lifestyle habits. This information is not intended to replace advice given to you by your health care provider. Make sure you discuss any questions you have with your health care provider. Document Revised: 08/25/2018 Document Reviewed: 04/05/2016 Elsevier Patient Education  2020 [employer]  

## 2019-08-10 NOTE — Progress Notes (Signed)
Established Patient Office Visit  Subjective:  Patient ID: Stuart Taylor, male    DOB: Jun 03, 1970  Age: 49 y.o. MRN: [id number]  CC:  Chief Complaint  Patient presents with  . Follow-up    1 month follow up on medication for depression, no concerns     HPI [name] presents for follow-up of his obesity and depression with anxiety.  Has been able to lose 17 pounds with the phentermine.  Tells me that his outlook is improved greatly.  There is lingering depression and anxiety but he is feeling better.  He is taking a vitamin D and calcium supplement from the [employer].  Past Medical History:  Diagnosis Date  . Anxiety and depression   . History of chicken pox     Past Surgical History:  Procedure Laterality Date  . APPENDECTOMY  11/2016  . EYE SURGERY     x4, [3]Left, [2] Right  . HERNIA REPAIR    . I & D EXTREMITY Left 12/16/2014   Procedure: IRRIGATION AND DEBRIDEMENT EXTREMITY;  Surgeon: [name], MD;  Location: [facility];  Service: Orthopedics;  Laterality: Left;  . TONSILLECTOMY      Family History  Problem Relation Age of Onset  . COPD Mother        Living  . Anxiety disorder Mother   . GER disease Mother   . Congestive Heart Failure Mother   . Sleep apnea Mother   . Arthritis Mother   . Kidney disease Mother   . Hypothyroidism Mother   . Hyperlipidemia Mother   . Pulmonary embolism Mother   . Anemia Mother   . Asthma Mother   . Heart attack Father 51       Deceased  . Deep vein thrombosis Father   . Cancer Father 15       Ureteral  . Allergic rhinitis Father   . Mental illness Sister   . Eczema Neg Hx   . Urticaria Neg Hx   . Immunodeficiency Neg Hx   . Angioedema Neg Hx     Social History   Socioeconomic History  . Marital status: Single    Spouse name: Not on file  . Number of children: Not on file  . Years of education: Not on file  . Highest education level: Not on file  Occupational History  . Not on file  Tobacco Use   . Smoking status: Former Smoker    Packs/day: 0.50    Years: 7.00    Pack years: 3.50    Types: Cigarettes    Quit date: 04/19/2016    Years since quitting: 3.3  . Smokeless tobacco: Never Used  Substance and Sexual Activity  . Alcohol use: Yes    Alcohol/week: 1.0 standard drinks    Types: 1 Glasses of wine per week    Comment: occa  . Drug use: No  . Sexual activity: Not Currently  Other Topics Concern  . Not on file  Social History Narrative  . Not on file   Social Determinants of Health   Financial Resource Strain:   . Difficulty of Paying Living Expenses:   Food Insecurity:   . Worried About Charity fundraiser in the Last Year:   . Arboriculturist in the Last Year:   Transportation Needs:   . Film/video editor (Medical):   Marland Kitchen Lack of Transportation (Non-Medical):   Physical Activity:   . Days of Exercise per Week:   .  Minutes of Exercise per Session:   Stress:   . Feeling of Stress :   Social Connections:   . Frequency of Communication with Friends and Family:   . Frequency of Social Gatherings with Friends and Family:   . Attends Religious Services:   . Active Member of Clubs or Organizations:   . Attends Banker Meetings:   Marland Kitchen Marital Status:   Intimate Partner Violence:   . Fear of Current or Ex-Partner:   . Emotionally Abused:   Marland Kitchen Physically Abused:   . Sexually Abused:     Outpatient Medications Prior to Visit  Medication Sig Dispense Refill  . b complex vitamins capsule Take 1 capsule by mouth daily.    Marland Kitchen buPROPion (WELLBUTRIN XL) 300 MG 24 hr tablet Take 1 tablet (300 mg total) by mouth daily. 30 tablet 1  . phentermine (ADIPEX-P) 37.5 MG tablet Take 37.5 mg by mouth daily before breakfast.    . finasteride (PROSCAR) 5 MG tablet Take 1 tablet (5 mg total) by mouth daily. (Patient not taking: Reported on 08/10/2019) 30 tablet 5  . fluticasone (FLONASE) 50 MCG/ACT nasal spray Use 1 spray per nostril 1-2 times daily as needed (Patient  not taking: Reported on 07/12/2019) 16 g 5  . levocetirizine (XYZAL) 5 MG tablet Take 1 tablet (5 mg total) by mouth daily as needed. (Patient not taking: Reported on 08/10/2019) 30 tablet 5  . Olopatadine HCl (PATADAY) 0.2 % SOLN Place 1 drop into both eyes 1 day or 1 dose. (Patient not taking: Reported on 07/12/2019) 1 Bottle 5  . pyridOXINE (VITAMIN B-6) 25 MG tablet Take 25 mg by mouth daily.    Marland Kitchen venlafaxine XR (EFFEXOR-XR) 37.5 MG 24 hr capsule Take 1 capsule (37.5 mg total) by mouth daily with breakfast. (Patient not taking: Reported on 07/12/2019) 30 capsule 2   No facility-administered medications prior to visit.    Allergies  Allergen Reactions  . Poison Oak Extract [Poison Oak Extract] Anaphylaxis, Swelling and Rash    ROS Review of Systems  Constitutional: Negative.   Respiratory: Negative.  Negative for chest tightness and shortness of breath.   Cardiovascular: Negative.  Negative for chest pain and palpitations.  Gastrointestinal: Negative.   Genitourinary: Negative.   Allergic/Immunologic: Negative for immunocompromised state.  Neurological: Negative for tremors and speech difficulty.  Psychiatric/Behavioral: Positive for dysphoric mood. Negative for self-injury. The patient is nervous/anxious.    Depression screen [facility] 2/9 08/10/2019 07/12/2019 07/22/2016  Decreased Interest 1 2 1   Down, Depressed, Hopeless 1 1 1   PHQ - 2 Score 2 3 2   Altered sleeping 1 1 -  Tired, decreased energy 1 2 -  Change in appetite 1 1 -  Feeling bad or failure about yourself  1 2 -  Trouble concentrating 2 3 -  Moving slowly or fidgety/restless 1 1 -  Suicidal thoughts 1 1 -  PHQ-9 Score 10 14 -  Difficult doing work/chores Somewhat difficult Somewhat difficult -      Objective:    Physical Exam  Constitutional: He is oriented to person, place, and time. He appears well-developed and well-nourished. No distress.  HENT:  Head: Normocephalic and atraumatic.  Right Ear: External ear  normal.  Left Ear: External ear normal.  Eyes: Conjunctivae are normal. Right eye exhibits no discharge. Left eye exhibits no discharge. No scleral icterus.  Neck: No JVD present. No tracheal deviation present.  Cardiovascular: Normal rate, regular rhythm and normal heart sounds.  Pulmonary/Chest: Effort normal and  breath sounds normal. No stridor.  Neurological: He is alert and oriented to person, place, and time.  Skin: Skin is warm and dry. He is not diaphoretic.  Psychiatric: He has a normal mood and affect. His behavior is normal.    BP 126/86   Pulse 77   Temp (!) 97.4 F (36.3 C) (Tympanic)   Ht 5\' 11"  (1.803 m)   Wt 259 lb 6.4 oz (117.7 kg)   SpO2 98%   BMI 36.18 kg/m  Wt Readings from Last 3 Encounters:  08/10/19 259 lb 6.4 oz (117.7 kg)  07/12/19 276 lb 9.6 oz (125.5 kg)  12/22/17 (!) 328 lb (148.8 kg)     There are no preventive care reminders to display for this patient.  There are no preventive care reminders to display for this patient.  Lab Results  Component Value Date   TSH 1.68 11/16/2017   Lab Results  Component Value Date   WBC 3.6 (L) 07/12/2019   HGB 15.0 07/12/2019   HCT 43.6 07/12/2019   MCV 88.3 07/12/2019   PLT 169.0 07/12/2019   Lab Results  Component Value Date   NA 137 07/12/2019   K 4.5 07/12/2019   CO2 26 07/12/2019   GLUCOSE 77 07/12/2019   BUN 16 07/12/2019   CREATININE 1.18 07/12/2019   BILITOT 0.6 07/12/2019   ALKPHOS 44 07/12/2019   AST 25 07/12/2019   ALT 34 07/12/2019   PROT 7.2 07/12/2019   ALBUMIN 4.4 07/12/2019   CALCIUM 9.4 07/12/2019   ANIONGAP 5 12/16/2014   GFR 66.03 07/12/2019   Lab Results  Component Value Date   CHOL 157 07/12/2019   Lab Results  Component Value Date   HDL 30.00 (L) 07/12/2019   Lab Results  Component Value Date   LDLCALC 101 (H) 07/12/2019   Lab Results  Component Value Date   TRIG 129.0 07/12/2019   Lab Results  Component Value Date   CHOLHDL 5 07/12/2019   Lab Results   Component Value Date   HGBA1C 5.0 06/10/2016      Assessment & Plan:   Problem List Items Addressed This Visit      Other   Anxiety and depression - Primary   Relevant Medications   buPROPion (WELLBUTRIN XL) 300 MG 24 hr tablet   Morbid obesity (HCC)   Relevant Medications   phentermine (ADIPEX-P) 37.5 MG tablet   Vitamin D deficiency   Relevant Medications   Cholecalciferol (VITAMIN D) 50 MCG (2000 UT) CAPS      Meds ordered this encounter  Medications  . phentermine (ADIPEX-P) 37.5 MG tablet    Sig: Take 1 tablet (37.5 mg total) by mouth daily before breakfast.    Dispense:  30 tablet    Refill:  2  . buPROPion (WELLBUTRIN XL) 300 MG 24 hr tablet    Sig: Take 1 tablet (300 mg total) by mouth daily.    Dispense:  30 tablet    Refill:  2  . Cholecalciferol (VITAMIN D) 50 MCG (2000 UT) CAPS    Sig: Take 2 daily    Dispense:  180 capsule    Refill:  1    Follow-up: Return in about 3 months (around 11/10/2019).  It is good to see the patient has lost some weight with the phentermine.  He realizes this is not a long-term solution.  He is not interested in bariatric surgery at this time.  He tells me that weight loss management is not covered by his  insurance.  He is still depressed but is feeling much better with his weight loss.  After his second coming Covid vaccine he plans to join the gym.  Assures me again that he mismarked the question on the PHQ-9 about self-harm.  We will try supplemental D3 at 4000 units daily.  He was given information on living with depression.  Mliss Sax, MD

## 2019-08-27 ENCOUNTER — Encounter: Payer: Self-pay | Admitting: Family Medicine

## 2019-09-18 ENCOUNTER — Encounter: Payer: Self-pay | Admitting: Family Medicine

## 2019-09-19 NOTE — Telephone Encounter (Signed)
RA symptoms may be associated with rashes and Vit D deficiency but most commonly involves the joints. Remind me at our next ov in June and I will check you for that.

## 2019-09-20 NOTE — Telephone Encounter (Signed)
If one has RA, it does not matter rather or not there is a rash or time passed. Please be patient. Perhaps I can see you sooner if this is causing you a great deal of anxiety.

## 2019-09-26 ENCOUNTER — Other Ambulatory Visit: Payer: Self-pay

## 2019-09-26 NOTE — Telephone Encounter (Addendum)
I scheduled patient for appointment to come in  office tomorrow for his rash is this ok? Pls advise

## 2019-09-27 ENCOUNTER — Encounter: Payer: Self-pay | Admitting: Family Medicine

## 2019-09-27 ENCOUNTER — Ambulatory Visit (INDEPENDENT_AMBULATORY_CARE_PROVIDER_SITE_OTHER): Payer: 59 | Admitting: Family Medicine

## 2019-09-27 VITALS — BP 128/78 | HR 77 | Temp 97.9°F | Ht 71.0 in | Wt 236.2 lb

## 2019-09-27 DIAGNOSIS — L0100 Impetigo, unspecified: Secondary | ICD-10-CM

## 2019-09-27 DIAGNOSIS — L255 Unspecified contact dermatitis due to plants, except food: Secondary | ICD-10-CM

## 2019-09-27 MED ORDER — CEPHALEXIN 500 MG PO CAPS: 500.0000 mg | ORAL_CAPSULE | Freq: Three times a day (TID) | ORAL | 0 refills | Status: AC

## 2019-09-27 MED ORDER — PREDNISONE 10 MG (21) PO TBPK: ORAL_TABLET | ORAL | 0 refills | Status: DC

## 2019-09-27 NOTE — Patient Instructions (Signed)
Poison Ivy Dermatitis Poison ivy dermatitis is inflammation of the skin that is caused by chemicals in the leaves of the poison ivy plant. The skin reaction often involves redness, swelling, blisters, and extreme itching. What are the causes? This condition is caused by a chemical (urushiol) found in the sap of the poison ivy plant. This chemical is sticky and can be easily spread to people, animals, and objects. You can get poison ivy dermatitis by:  Having direct contact with a poison ivy plant.  Touching animals, other people, or objects that have come in contact with poison ivy and have the chemical on them. What increases the risk? This condition is more likely to develop in people who:  Are outdoors often in wooded or marshy areas.  Go outdoors without wearing protective clothing, such as closed shoes, long pants, and a long-sleeved shirt. What are the signs or symptoms? Symptoms of this condition include:  Redness of the skin.  Extreme itching.  A rash that often includes bumps and blisters. The rash usually appears 48 hours after exposure, if you have been exposed before. If this is the first time you have been exposed, the rash may not appear until a week after exposure.  Swelling. This may occur if the reaction is more severe. Symptoms usually last for 1-2 weeks. However, the first time you develop this condition, symptoms may last 3-4 weeks. How is this diagnosed? This condition may be diagnosed based on your symptoms and a physical exam. Your health care provider may also ask you about any recent outdoor activity. How is this treated? Treatment for this condition will vary depending on how severe it is. Treatment may include:  Hydrocortisone cream or calamine lotion to relieve itching.  Oatmeal baths to soothe the skin.  Medicines, such as over-the-counter antihistamine tablets.  Oral steroid medicine, for more severe reactions. Follow these instructions at  home: Medicines  Take or apply over-the-counter and prescription medicines only as told by your health care provider.  Use hydrocortisone cream or calamine lotion as needed to soothe the skin and relieve itching. General instructions  Do not scratch or rub your skin.  Apply a cold, wet cloth (cold compress) to the affected areas or take baths in cool water. This will help with itching. Avoid hot baths and showers.  Take oatmeal baths as needed. Use colloidal oatmeal. You can get this at your local pharmacy or grocery store. Follow the instructions on the packaging.  While you have the rash, wash clothes right after you wear them.  Keep all follow-up visits as told by your health care provider. This is important. How is this prevented?   Learn to identify the poison ivy plant and avoid contact with the plant. This plant can be recognized by the number of leaves. Generally, poison ivy has three leaves with flowering branches on a single stem. The leaves are typically glossy, and they have jagged edges that come to a point at the front.  If you have been exposed to poison ivy, thoroughly wash with soap and water right away. You have about 30 minutes to remove the plant resin before it will cause the rash. Be sure to wash under your fingernails, because any plant resin there will continue to spread the rash.  When hiking or camping, wear clothes that will help you to avoid exposure on the skin. This includes long pants, a long-sleeved shirt, tall socks, and hiking boots. You can also apply preventive lotion to your skin to   help limit exposure.  If you suspect that your clothes or outdoor gear came in contact with poison ivy, rinse them off outside with a garden hose before you bring them inside your house.  When doing yard work or gardening, wear gloves, long sleeves, long pants, and boots. Wash your garden tools and gloves if they come in contact with poison ivy.  If you suspect that your  pet has come into contact with poison ivy, wash him or her with pet shampoo and water. Make sure to wear gloves while washing your pet. Contact a health care provider if you have:  Open sores in the rash area.  More redness, swelling, or pain in the affected area.  Redness that spreads beyond the rash area.  Fluid, blood, or pus coming from the affected area.  A fever.  A rash over a large area of your body.  A rash on your eyes, mouth, or genitals.  A rash that does not improve after a few weeks. Get help right away if:  Your face swells or your eyes swell shut.  You have trouble breathing.  You have trouble swallowing. These symptoms may represent a serious problem that is an emergency. Do not wait to see if the symptoms will go away. Get medical help right away. Call your local emergency services (911 in the [place] Do not drive yourself to the hospital. Summary  Poison ivy dermatitis is inflammation of the skin that is caused by chemicals in the leaves of the poison ivy plant.  Symptoms of this condition include redness, itching, a rash, and swelling.  Do not scratch or rub your skin.  Take or apply over-the-counter and prescription medicines only as told by your health care provider. This information is not intended to replace advice given to you by your health care provider. Make sure you discuss any questions you have with your health care provider. Document Revised: 08/25/2018 Document Reviewed: 04/28/2018 Elsevier Patient Education  2020 [employer] Inc[employer]  

## 2019-09-27 NOTE — Progress Notes (Signed)
Established Patient Office Visit  Subjective:  Patient ID: Stuart Taylor, male    DOB: 19-Jun-1970  Age: 49 y.o. MRN: [id number]  CC:  Chief Complaint  Patient presents with  . Rash    rash on arms, legs and torso x 2-3 weeks warm to touch.     HPI TIDUS UPCHURCH presents for evaluation and treatment of a 3-week history of a pruritic rash on his legs and arms.  On the left wrist there is swelling with oozing and yellowish crust.  There is some across his lower abdomen.  Interdigital spaces and private areas are not affected.  He is treated it with Benadryl cream with some symptom relief.  There has been no fever or chills.  He does have dogs.  He knows of known exposure to poison ivy or poison oak.  He brought with him lab work that had been run by a rheumatologist with a positive ANA and negative sed rate for me to see.  Patient denies ongoing swelling and stiffness in his joints.  He had been given hydroxychloroquine to try but said that it has not helped.  Past Medical History:  Diagnosis Date  . Anxiety and depression   . History of chicken pox     Past Surgical History:  Procedure Laterality Date  . APPENDECTOMY  11/2016  . EYE SURGERY     x4, [3]Left, [2] Right  . HERNIA REPAIR    . I & D EXTREMITY Left 12/16/2014   Procedure: IRRIGATION AND DEBRIDEMENT EXTREMITY;  Surgeon: Bradly Bienenstock, MD;  Location: [facility];  Service: Orthopedics;  Laterality: Left;  . TONSILLECTOMY      Family History  Problem Relation Age of Onset  . COPD Mother        Living  . Anxiety disorder Mother   . GER disease Mother   . Congestive Heart Failure Mother   . Sleep apnea Mother   . Arthritis Mother   . Kidney disease Mother   . Hypothyroidism Mother   . Hyperlipidemia Mother   . Pulmonary embolism Mother   . Anemia Mother   . Asthma Mother   . Heart attack Father 69       Deceased  . Deep vein thrombosis Father   . Cancer Father 46       Ureteral  . Allergic rhinitis Father   .  Mental illness Sister   . Eczema Neg Hx   . Urticaria Neg Hx   . Immunodeficiency Neg Hx   . Angioedema Neg Hx     Social History   Socioeconomic History  . Marital status: Single    Spouse name: Not on file  . Number of children: Not on file  . Years of education: Not on file  . Highest education level: Not on file  Occupational History  . Not on file  Tobacco Use  . Smoking status: Former Smoker    Packs/day: 0.50    Years: 7.00    Pack years: 3.50    Types: Cigarettes    Quit date: 04/19/2016    Years since quitting: 3.4  . Smokeless tobacco: Never Used  Substance and Sexual Activity  . Alcohol use: Yes    Alcohol/week: 1.0 standard drinks    Types: 1 Glasses of wine per week    Comment: occa  . Drug use: No  . Sexual activity: Not Currently  Other Topics Concern  . Not on file  Social History Narrative  . Not  on file   Social Determinants of Health   Financial Resource Strain:   . Difficulty of Paying Living Expenses:   Food Insecurity:   . Worried About Charity fundraiser in the Last Year:   . Arboriculturist in the Last Year:   Transportation Needs:   . Film/video editor (Medical):   Marland Kitchen Lack of Transportation (Non-Medical):   Physical Activity:   . Days of Exercise per Week:   . Minutes of Exercise per Session:   Stress:   . Feeling of Stress :   Social Connections:   . Frequency of Communication with Friends and Family:   . Frequency of Social Gatherings with Friends and Family:   . Attends Religious Services:   . Active Member of Clubs or Organizations:   . Attends Archivist Meetings:   Marland Kitchen Marital Status:   Intimate Partner Violence:   . Fear of Current or Ex-Partner:   . Emotionally Abused:   Marland Kitchen Physically Abused:   . Sexually Abused:     Outpatient Medications Prior to Visit  Medication Sig Dispense Refill  . b complex vitamins capsule Take 1 capsule by mouth daily.    Marland Kitchen buPROPion (WELLBUTRIN XL) 300 MG 24 hr tablet Take 1  tablet (300 mg total) by mouth daily. 30 tablet 2  . Cholecalciferol (VITAMIN D) 50 MCG (2000 UT) CAPS Take 2 daily 180 capsule 1  . phentermine (ADIPEX-P) 37.5 MG tablet Take 1 tablet (37.5 mg total) by mouth daily before breakfast. 30 tablet 2   No facility-administered medications prior to visit.    Allergies  Allergen Reactions  . Poison Oak Extract [Poison Oak Extract] Anaphylaxis, Swelling and Rash    ROS Review of Systems  Constitutional: Negative for appetite change, chills, diaphoresis, fatigue, fever and unexpected weight change.  Respiratory: Negative.   Cardiovascular: Negative.   Gastrointestinal: Negative.   Musculoskeletal: Negative for arthralgias and myalgias.  Skin: Positive for color change and rash. Negative for wound.  Hematological: Does not bruise/bleed easily.  Psychiatric/Behavioral: Negative.       Objective:    Physical Exam  Constitutional: He is oriented to person, place, and time. He appears well-developed and well-nourished. No distress.  HENT:  Head: Normocephalic and atraumatic.  Right Ear: External ear normal.  Left Ear: External ear normal.  Eyes: Conjunctivae are normal. Right eye exhibits no discharge. Left eye exhibits no discharge. No scleral icterus.  Neck: No JVD present. No tracheal deviation present.  Pulmonary/Chest: Effort normal. No stridor.  Neurological: He is alert and oriented to person, place, and time.  Skin: Skin is warm and dry. He is not diaphoretic.     Psychiatric: He has a normal mood and affect. His behavior is normal.    BP 128/78   Pulse 77   Temp 97.9 F (36.6 C) (Tympanic)   Ht 5\' 11"  (1.803 m)   Wt 236 lb 3.2 oz (107.1 kg)   SpO2 98%   BMI 32.94 kg/m  Wt Readings from Last 3 Encounters:  09/27/19 236 lb 3.2 oz (107.1 kg)  08/10/19 259 lb 6.4 oz (117.7 kg)  07/12/19 276 lb 9.6 oz (125.5 kg)     There are no preventive care reminders to display for this patient.  There are no preventive care  reminders to display for this patient.  Lab Results  Component Value Date   TSH 1.68 11/16/2017   Lab Results  Component Value Date   WBC 3.6 (L) 07/12/2019  HGB 15.0 07/12/2019   HCT 43.6 07/12/2019   MCV 88.3 07/12/2019   PLT 169.0 07/12/2019   Lab Results  Component Value Date   NA 137 07/12/2019   K 4.5 07/12/2019   CO2 26 07/12/2019   GLUCOSE 77 07/12/2019   BUN 16 07/12/2019   CREATININE 1.18 07/12/2019   BILITOT 0.6 07/12/2019   ALKPHOS 44 07/12/2019   AST 25 07/12/2019   ALT 34 07/12/2019   PROT 7.2 07/12/2019   ALBUMIN 4.4 07/12/2019   CALCIUM 9.4 07/12/2019   ANIONGAP 5 12/16/2014   GFR 66.03 07/12/2019   Lab Results  Component Value Date   CHOL 157 07/12/2019   Lab Results  Component Value Date   HDL 30.00 (L) 07/12/2019   Lab Results  Component Value Date   LDLCALC 101 (H) 07/12/2019   Lab Results  Component Value Date   TRIG 129.0 07/12/2019   Lab Results  Component Value Date   CHOLHDL 5 07/12/2019   Lab Results  Component Value Date   HGBA1C 5.0 06/10/2016      Assessment & Plan:   Problem List Items Addressed This Visit    None    Visit Diagnoses    Rhus dermatitis    -  Primary   Relevant Medications   predniSONE (STERAPRED UNI-PAK 21 TAB) 10 MG (21) TBPK tablet   Impetigo       Relevant Medications   cephALEXin (KEFLEX) 500 MG capsule      Meds ordered this encounter  Medications  . predniSONE (STERAPRED UNI-PAK 21 TAB) 10 MG (21) TBPK tablet    Sig: Take 6 today, 5 tomorrow, 4 the next day and then 3, 2, 1 and stop    Dispense:  21 tablet    Refill:  0  . cephALEXin (KEFLEX) 500 MG capsule    Sig: Take 1 capsule (500 mg total) by mouth 3 (three) times daily for 10 days.    Dispense:  30 capsule    Refill:  0    Follow-up: Return if symptoms worsen or fail to improve.  And using a shorter course of prednisone than usual because the patient is in the third week of his poison ivy rash.  10 days of Keflex to treat  the impetigo.  Mliss Sax, MD

## 2019-11-12 ENCOUNTER — Encounter: Payer: Self-pay | Admitting: Family Medicine

## 2019-11-12 ENCOUNTER — Ambulatory Visit (INDEPENDENT_AMBULATORY_CARE_PROVIDER_SITE_OTHER): Payer: 59 | Admitting: Family Medicine

## 2019-11-12 ENCOUNTER — Other Ambulatory Visit: Payer: Self-pay

## 2019-11-12 DIAGNOSIS — F32A Depression, unspecified: Secondary | ICD-10-CM

## 2019-11-12 DIAGNOSIS — F419 Anxiety disorder, unspecified: Secondary | ICD-10-CM

## 2019-11-12 DIAGNOSIS — F329 Major depressive disorder, single episode, unspecified: Secondary | ICD-10-CM | POA: Diagnosis not present

## 2019-11-12 DIAGNOSIS — E559 Vitamin D deficiency, unspecified: Secondary | ICD-10-CM | POA: Diagnosis not present

## 2019-11-12 MED ORDER — BUPROPION HCL ER (XL) 300 MG PO TB24: 300.0000 mg | ORAL_TABLET | Freq: Every day | ORAL | 2 refills | Status: DC

## 2019-11-12 NOTE — Progress Notes (Signed)
Established Patient Office Visit  Subjective:  Patient ID: Stuart Taylor, male    DOB: May 28, 1970  Age: 49 y.o. MRN: [id number]  CC:  Chief Complaint  Patient presents with  . Follow-up    3 month follow up on depression, no concerns. Patient states that rash have dissappeared    HPI Stuart Taylor presents for follow-up of depression and obesity.  He is doing okay with tolerating the drug well.  Has been able to lose some weight.  Is currently started exercising some.  Feeling better.  Past Medical History:  Diagnosis Date  . Anxiety and depression   . History of chicken pox     Past Surgical History:  Procedure Laterality Date  . APPENDECTOMY  11/2016  . EYE SURGERY     x4, [3]Left, [2] Right  . HERNIA REPAIR    . I & D EXTREMITY Left 12/16/2014   Procedure: IRRIGATION AND DEBRIDEMENT EXTREMITY;  Surgeon: [name], MD;  Location: [facility];  Service: Orthopedics;  Laterality: Left;  . TONSILLECTOMY      Family History  Problem Relation Age of Onset  . COPD Mother        Living  . Anxiety disorder Mother   . GER disease Mother   . Congestive Heart Failure Mother   . Sleep apnea Mother   . Arthritis Mother   . Kidney disease Mother   . Hypothyroidism Mother   . Hyperlipidemia Mother   . Pulmonary embolism Mother   . Anemia Mother   . Asthma Mother   . Heart attack Father 88       Deceased  . Deep vein thrombosis Father   . Cancer Father 16       Ureteral  . Allergic rhinitis Father   . Mental illness Sister   . Eczema Neg Hx   . Urticaria Neg Hx   . Immunodeficiency Neg Hx   . Angioedema Neg Hx     Social History   Socioeconomic History  . Marital status: Single    Spouse name: Not on file  . Number of children: Not on file  . Years of education: Not on file  . Highest education level: Not on file  Occupational History  . Not on file  Tobacco Use  . Smoking status: Former Smoker    Packs/day: 0.50    Years: 7.00    Pack years: 3.50     Types: Cigarettes    Quit date: 04/19/2016    Years since quitting: 3.5  . Smokeless tobacco: Never Used  Vaping Use  . Vaping Use: Never used  Substance and Sexual Activity  . Alcohol use: Yes    Alcohol/week: 1.0 standard drink    Types: 1 Glasses of wine per week    Comment: occa  . Drug use: No  . Sexual activity: Not Currently  Other Topics Concern  . Not on file  Social History Narrative  . Not on file   Social Determinants of Health   Financial Resource Strain:   . Difficulty of Paying Living Expenses:   Food Insecurity:   . Worried About Charity fundraiser in the Last Year:   . Arboriculturist in the Last Year:   Transportation Needs:   . Film/video editor (Medical):   Marland Kitchen Lack of Transportation (Non-Medical):   Physical Activity:   . Days of Exercise per Week:   . Minutes of Exercise per Session:   Stress:   .  Feeling of Stress :   Social Connections:   . Frequency of Communication with Friends and Family:   . Frequency of Social Gatherings with Friends and Family:   . Attends Religious Services:   . Active Member of Clubs or Organizations:   . Attends Banker Meetings:   Marland Kitchen Marital Status:   Intimate Partner Violence:   . Fear of Current or Ex-Partner:   . Emotionally Abused:   Marland Kitchen Physically Abused:   . Sexually Abused:     Outpatient Medications Prior to Visit  Medication Sig Dispense Refill  . b complex vitamins capsule Take 1 capsule by mouth daily.    . Cholecalciferol (VITAMIN D) 50 MCG (2000 UT) CAPS Take 2 daily 180 capsule 1  . phentermine (ADIPEX-P) 37.5 MG tablet Take 1 tablet (37.5 mg total) by mouth daily before breakfast. 30 tablet 2  . buPROPion (WELLBUTRIN XL) 300 MG 24 hr tablet Take 1 tablet (300 mg total) by mouth daily. 30 tablet 2  . predniSONE (STERAPRED UNI-PAK 21 TAB) 10 MG (21) TBPK tablet Take 6 today, 5 tomorrow, 4 the next day and then 3, 2, 1 and stop 21 tablet 0   No facility-administered medications prior to  visit.    Allergies  Allergen Reactions  . Poison Oak Extract [Poison Oak Extract] Anaphylaxis, Swelling and Rash    ROS Review of Systems  Constitutional: Negative.   HENT: Negative.   Eyes: Negative for photophobia and visual disturbance.  Respiratory: Negative.   Cardiovascular: Negative.   Gastrointestinal: Negative.   Endocrine: Negative for polyphagia and polyuria.  Genitourinary: Negative.   Musculoskeletal: Negative for gait problem and joint swelling.  Skin: Negative for pallor and rash.  Allergic/Immunologic: Negative for immunocompromised state.  Neurological: Negative for light-headedness and numbness.  Hematological: Does not bruise/bleed easily.  Psychiatric/Behavioral: Negative.    Depression screen [facility] 2/9 11/12/2019 08/10/2019 07/12/2019  Decreased Interest 0 1 2  Down, Depressed, Hopeless 1 1 1   PHQ - 2 Score 1 2 3   Altered sleeping 0 1 1  Tired, decreased energy 0 1 2  Change in appetite 0 1 1  Feeling bad or failure about yourself  1 1 2   Trouble concentrating 0 2 3  Moving slowly or fidgety/restless 0 1 1  Suicidal thoughts 0 1 1  PHQ-9 Score 2 10 14   Difficult doing work/chores Not difficult at all Somewhat difficult Somewhat difficult      Objective:    Physical Exam Vitals and nursing note reviewed.  Constitutional:      General: He is not in acute distress.    Appearance: Normal appearance. He is not ill-appearing, toxic-appearing or diaphoretic.  HENT:     Head: Normocephalic and atraumatic.  Eyes:     General: No scleral icterus.       Right eye: No discharge.        Left eye: No discharge.     Extraocular Movements: Extraocular movements intact.     Conjunctiva/sclera: Conjunctivae normal.     Pupils: Pupils are equal, round, and reactive to light.  Cardiovascular:     Rate and Rhythm: Normal rate and regular rhythm.  Pulmonary:     Effort: Pulmonary effort is normal.     Breath sounds: Normal breath sounds.  Neurological:      General: No focal deficit present.     Mental Status: He is alert and oriented to person, place, and time.  Psychiatric:        Mood  and Affect: Mood normal.        Behavior: Behavior normal.     BP 122/74   Pulse 82   Temp 97.8 F (36.6 C) (Tympanic)   Ht 5\' 11"  (1.803 m)   Wt 232 lb 3.2 oz (105.3 kg)   SpO2 97%   BMI 32.39 kg/m  Wt Readings from Last 3 Encounters:  11/12/19 232 lb 3.2 oz (105.3 kg)  09/27/19 236 lb 3.2 oz (107.1 kg)  08/10/19 259 lb 6.4 oz (117.7 kg)     There are no preventive care reminders to display for this patient.  There are no preventive care reminders to display for this patient.  Lab Results  Component Value Date   TSH 1.68 11/16/2017   Lab Results  Component Value Date   WBC 3.6 (L) 07/12/2019   HGB 15.0 07/12/2019   HCT 43.6 07/12/2019   MCV 88.3 07/12/2019   PLT 169.0 07/12/2019   Lab Results  Component Value Date   NA 137 07/12/2019   K 4.5 07/12/2019   CO2 26 07/12/2019   GLUCOSE 77 07/12/2019   BUN 16 07/12/2019   CREATININE 1.18 07/12/2019   BILITOT 0.6 07/12/2019   ALKPHOS 44 07/12/2019   AST 25 07/12/2019   ALT 34 07/12/2019   PROT 7.2 07/12/2019   ALBUMIN 4.4 07/12/2019   CALCIUM 9.4 07/12/2019   ANIONGAP 5 12/16/2014   GFR 66.03 07/12/2019   Lab Results  Component Value Date   CHOL 157 07/12/2019   Lab Results  Component Value Date   HDL 30.00 (L) 07/12/2019   Lab Results  Component Value Date   LDLCALC 101 (H) 07/12/2019   Lab Results  Component Value Date   TRIG 129.0 07/12/2019   Lab Results  Component Value Date   CHOLHDL 5 07/12/2019   Lab Results  Component Value Date   HGBA1C 5.0 06/10/2016      Assessment & Plan:   Problem List Items Addressed This Visit      Other   Anxiety and depression   Relevant Medications   buPROPion (WELLBUTRIN XL) 300 MG 24 hr tablet   Morbid obesity (HCC) - Primary   Vitamin D deficiency      Meds ordered this encounter  Medications  . buPROPion  (WELLBUTRIN XL) 300 MG 24 hr tablet    Sig: Take 1 tablet (300 mg total) by mouth daily.    Dispense:  90 tablet    Refill:  2    Follow-up: Return in about 6 months (around 05/13/2020).   Encourage continue weight loss and exercise.  Continue Wellbutrin.  Did discuss the yoyo effect with weight loss pills.  If we start to see this occurring in him will have to pursue a different direction than the phentermine. 05/15/2020, MD

## 2019-12-04 ENCOUNTER — Encounter: Payer: Self-pay | Admitting: Family Medicine

## 2020-05-06 ENCOUNTER — Ambulatory Visit: Payer: 59 | Admitting: Family Medicine

## 2022-02-05 ENCOUNTER — Encounter (HOSPITAL_BASED_OUTPATIENT_CLINIC_OR_DEPARTMENT_OTHER): Payer: Self-pay | Admitting: Emergency Medicine

## 2022-02-05 ENCOUNTER — Other Ambulatory Visit: Payer: Self-pay

## 2022-02-05 ENCOUNTER — Emergency Department (HOSPITAL_BASED_OUTPATIENT_CLINIC_OR_DEPARTMENT_OTHER)
Admission: EM | Admit: 2022-02-05 | Discharge: 2022-02-06 | Disposition: A | Discharge status: Home / Self Care | Payer: Managed Care, Other (non HMO) | Attending: Emergency Medicine | Admitting: Emergency Medicine

## 2022-02-05 DIAGNOSIS — M7989 Other specified soft tissue disorders: Secondary | ICD-10-CM | POA: Diagnosis not present

## 2022-02-05 DIAGNOSIS — R0989 Other specified symptoms and signs involving the circulatory and respiratory systems: Secondary | ICD-10-CM | POA: Diagnosis not present

## 2022-02-05 DIAGNOSIS — R2241 Localized swelling, mass and lump, right lower limb: Secondary | ICD-10-CM | POA: Diagnosis present

## 2022-02-05 DIAGNOSIS — Z87891 Personal history of nicotine dependence: Secondary | ICD-10-CM | POA: Insufficient documentation

## 2022-02-05 DIAGNOSIS — R112 Nausea with vomiting, unspecified: Secondary | ICD-10-CM

## 2022-02-05 DIAGNOSIS — L03115 Cellulitis of right lower limb: Secondary | ICD-10-CM | POA: Diagnosis not present

## 2022-02-05 NOTE — ED Triage Notes (Signed)
Patient states he ate at the chinese buffet tonight which caused him to have bilateral lower leg swelling, reports he is nauseated at this time.

## 2022-02-06 ENCOUNTER — Ambulatory Visit (HOSPITAL_BASED_OUTPATIENT_CLINIC_OR_DEPARTMENT_OTHER)
Admission: RE | Admit: 2022-02-06 | Discharge: 2022-02-06 | Disposition: A | Discharge status: Home / Self Care | Payer: Managed Care, Other (non HMO) | Source: Ambulatory Visit | Attending: Emergency Medicine | Admitting: Emergency Medicine

## 2022-02-06 DIAGNOSIS — R590 Localized enlarged lymph nodes: Secondary | ICD-10-CM | POA: Insufficient documentation

## 2022-02-06 DIAGNOSIS — M7989 Other specified soft tissue disorders: Secondary | ICD-10-CM | POA: Insufficient documentation

## 2022-02-06 LAB — CBC WITH DIFFERENTIAL/PLATELET
Abs Immature Granulocytes: 0.07 10*3/uL (ref 0.00–0.07)
Basophils Absolute: 0 10*3/uL (ref 0.0–0.1)
Basophils Relative: 0 %
Eosinophils Absolute: 0 10*3/uL (ref 0.0–0.5)
Eosinophils Relative: 0 %
HCT: 39.8 % (ref 39.0–52.0)
Hemoglobin: 14.7 g/dL (ref 13.0–17.0)
Immature Granulocytes: 1 %
Lymphocytes Relative: 4 %
Lymphs Abs: 0.6 10*3/uL — ABNORMAL LOW (ref 0.7–4.0)
MCH: 30.8 pg (ref 26.0–34.0)
MCHC: 36.9 g/dL — ABNORMAL HIGH (ref 30.0–36.0)
MCV: 83.4 fL (ref 80.0–100.0)
Monocytes Absolute: 1 10*3/uL (ref 0.1–1.0)
Monocytes Relative: 7 %
Neutro Abs: 13 10*3/uL — ABNORMAL HIGH (ref 1.7–7.7)
Neutrophils Relative %: 88 %
Platelets: 148 10*3/uL — ABNORMAL LOW (ref 150–400)
RBC: 4.77 MIL/uL (ref 4.22–5.81)
RDW: 12 % (ref 11.5–15.5)
WBC: 14.7 10*3/uL — ABNORMAL HIGH (ref 4.0–10.5)
nRBC: 0 % (ref 0.0–0.2)

## 2022-02-06 LAB — URINALYSIS, MICROSCOPIC (REFLEX)

## 2022-02-06 LAB — URINALYSIS, ROUTINE W REFLEX MICROSCOPIC
Bilirubin Urine: NEGATIVE
Glucose, UA: NEGATIVE mg/dL
Ketones, ur: NEGATIVE mg/dL
Leukocytes,Ua: NEGATIVE
Nitrite: NEGATIVE
Protein, ur: NEGATIVE mg/dL
Specific Gravity, Urine: 1.01 (ref 1.005–1.030)
pH: 7 (ref 5.0–8.0)

## 2022-02-06 LAB — BASIC METABOLIC PANEL
Anion gap: 8 (ref 5–15)
BUN: 18 mg/dL (ref 6–20)
CO2: 23 mmol/L (ref 22–32)
Calcium: 9.2 mg/dL (ref 8.9–10.3)
Chloride: 104 mmol/L (ref 98–111)
Creatinine, Ser: 1.27 mg/dL — ABNORMAL HIGH (ref 0.61–1.24)
GFR, Estimated: >60 mL/min (ref >60)
Glucose, Bld: 118 mg/dL — ABNORMAL HIGH (ref 70–99)
Potassium: 4.1 mmol/L (ref 3.5–5.1)
Sodium: 135 mmol/L (ref 135–145)

## 2022-02-06 LAB — D-DIMER, QUANTITATIVE: D-Dimer, Quant: 3.06 ug/mL-FEU — ABNORMAL HIGH (ref 0.00–0.50)

## 2022-02-06 MED ORDER — ONDANSETRON 8 MG PO TBDP: 8.0000 mg | ORAL_TABLET | Freq: Three times a day (TID) | ORAL | 0 refills | Status: DC | PRN

## 2022-02-06 MED ORDER — ONDANSETRON HCL 4 MG/2ML IJ SOLN
4.0000 mg | Freq: Once | INTRAMUSCULAR | Status: AC
  Administered 2022-02-06: 4 mg via INTRAVENOUS
  Filled 2022-02-06: qty 2

## 2022-02-06 MED ORDER — KETOROLAC TROMETHAMINE 15 MG/ML IJ SOLN
15.0000 mg | Freq: Once | INTRAMUSCULAR | Status: AC
  Administered 2022-02-06: 15 mg via INTRAVENOUS
  Filled 2022-02-06: qty 1

## 2022-02-06 MED ORDER — CEFAZOLIN SODIUM-DEXTROSE 1-4 GM/50ML-% IV SOLN
1.0000 g | Freq: Once | INTRAVENOUS | Status: AC
  Administered 2022-02-06: 1 g via INTRAVENOUS
  Filled 2022-02-06: qty 50

## 2022-02-06 MED ORDER — ENOXAPARIN SODIUM 150 MG/ML IJ SOSY
1.0000 mg/kg | PREFILLED_SYRINGE | Freq: Once | INTRAMUSCULAR | Status: AC
  Administered 2022-02-06: 135 mg via SUBCUTANEOUS
  Filled 2022-02-06: qty 1

## 2022-02-06 MED ORDER — SODIUM CHLORIDE 0.9 % IV BOLUS
1000.0000 mL | Freq: Once | INTRAVENOUS | Status: AC
  Administered 2022-02-06: 1000 mL via INTRAVENOUS

## 2022-02-06 MED ORDER — CEFUROXIME AXETIL 500 MG PO TABS: 500.0000 mg | ORAL_TABLET | Freq: Two times a day (BID) | ORAL | 0 refills | Status: DC

## 2022-02-06 NOTE — ED Provider Notes (Signed)
[facility] Provider Note: Georgena Spurling, MD, FACEP  CSN: [id number] MRN: [id number] ARRIVAL: 02/05/22 at 2307 ROOM: MHOTF/OTF   CHIEF COMPLAINT  Leg Swelling   HISTORY OF PRESENT ILLNESS  02/06/22 12:43 AM Stuart Taylor is a 51 y.o. male who ate at a [place] buffet about 3:30 PM yesterday afternoon.  Soon after eating he developed nausea and vomiting as well as swelling, erythema and pain in his right lower leg.  He rates the pain as an 8 out of 10 and describes it as aching and tightness.  He states he feels like his leg is going to explode.  The vomiting sided for a while but returned later in the evening.  He has had no associated abdominal pain or diarrhea.  He is urine looks lighter than usual.  He is not a diabetic to his knowledge.  He also has a headache.   Past Medical History:  Diagnosis Date   Anxiety and depression    History of chicken pox     Past Surgical History:  Procedure Laterality Date   APPENDECTOMY  11/2016   EYE SURGERY     x4, [3]Left, [2] Right   HERNIA REPAIR     I & D EXTREMITY Left 12/16/2014   Procedure: IRRIGATION AND DEBRIDEMENT EXTREMITY;  Surgeon: [name], MD;  Location: [facility];  Service: Orthopedics;  Laterality: Left;   TONSILLECTOMY      Family History  Problem Relation Age of Onset   COPD Mother        Living   Anxiety disorder Mother    GER disease Mother    Congestive Heart Failure Mother    Sleep apnea Mother    Arthritis Mother    Kidney disease Mother    Hypothyroidism Mother    Hyperlipidemia Mother    Pulmonary embolism Mother    Anemia Mother    Asthma Mother    Heart attack Father 103       Deceased   Deep vein thrombosis Father    Cancer Father 21       Ureteral   Allergic rhinitis Father    Mental illness Sister    Eczema Neg Hx    Urticaria Neg Hx    Immunodeficiency Neg Hx    Angioedema Neg Hx     Social History   Tobacco Use   Smoking status: Former    Packs/day: 0.50    Years: 7.00     Total pack years: 3.50    Types: Cigarettes    Quit date: 04/19/2016    Years since quitting: 5.8   Smokeless tobacco: Never  Vaping Use   Vaping Use: Never used  Substance Use Topics   Alcohol use: Yes    Alcohol/week: 1.0 standard drink of alcohol    Types: 1 Glasses of wine per week    Comment: occa   Drug use: No    Prior to Admission medications   Medication Sig Start Date End Date Taking? Authorizing Provider  cefUROXime (CEFTIN) 500 MG tablet Take 1 tablet (500 mg total) by mouth 2 (two) times daily with a meal. 02/06/22  Yes Ennis Delpozo, MD  b complex vitamins capsule Take 1 capsule by mouth daily.    [provider]  buPROPion (WELLBUTRIN XL) 300 MG 24 hr tablet Take 1 tablet (300 mg total) by mouth daily. 11/12/19   Libby Maw, MD  Cholecalciferol (VITAMIN D) 50 MCG (2000 UT) CAPS Take 2 daily 08/10/19  Libby Maw, MD  ondansetron (ZOFRAN-ODT) 8 MG disintegrating tablet Take 1 tablet (8 mg total) by mouth every 8 (eight) hours as needed for nausea or vomiting. 02/06/22   Terence Googe, Jenny Reichmann, MD  phentermine (ADIPEX-P) 37.5 MG tablet Take 1 tablet (37.5 mg total) by mouth daily before breakfast. 08/10/19   Libby Maw, MD    Allergies Poison oak extract [poison oak extract]   REVIEW OF SYSTEMS  Negative except as noted here or in the History of Present Illness.   PHYSICAL EXAMINATION  Initial Vital Signs Blood pressure (!) 145/98, pulse (!) 106, temperature 98.3 F (36.8 C), temperature source Oral, resp. rate (!) 22, height 5\' 10"  (1.778 m), weight 136.1 kg, SpO2 97 %.  Examination General: Well-developed, well-nourished male in no acute distress; appearance consistent with age of record HENT: normocephalic; atraumatic Eyes: Normal appearance Neck: supple Heart: regular rate and rhythm Lungs: clear to auscultation bilaterally Abdomen: soft; nondistended; nontender; bowel sounds present Extremities: No deformity; full range  of motion; pulses normal; tenderness, erythema, warmth and mild edema of anterior right lower leg:    Neurologic: Awake, alert and oriented; motor function intact in all extremities and symmetric; no facial droop Skin: Warm and dry Psychiatric: Normal mood and affect   RESULTS  Summary of this visit's results, reviewed and interpreted by myself:   EKG Interpretation  Date/Time:    Ventricular Rate:    PR Interval:    QRS Duration:   QT Interval:    QTC Calculation:   R Axis:     Text Interpretation:         Laboratory Studies: Results for orders placed or performed during the hospital encounter of 02/05/22 (from the past 24 hour(s))  CBC with Differential     Status: Abnormal   Collection Time: 02/06/22  1:08 AM  Result Value Ref Range   WBC 14.7 (H) 4.0 - 10.5 K/uL   RBC 4.77 4.22 - 5.81 MIL/uL   Hemoglobin 14.7 13.0 - 17.0 g/dL   HCT 39.8 39.0 - 52.0 %   MCV 83.4 80.0 - 100.0 fL   MCH 30.8 26.0 - 34.0 pg   MCHC 36.9 (H) 30.0 - 36.0 g/dL   RDW 12.0 11.5 - 15.5 %   Platelets 148 (L) 150 - 400 K/uL   nRBC 0.0 0.0 - 0.2 %   Neutrophils Relative % 88 %   Neutro Abs 13.0 (H) 1.7 - 7.7 K/uL   Lymphocytes Relative 4 %   Lymphs Abs 0.6 (L) 0.7 - 4.0 K/uL   Monocytes Relative 7 %   Monocytes Absolute 1.0 0.1 - 1.0 K/uL   Eosinophils Relative 0 %   Eosinophils Absolute 0.0 0.0 - 0.5 K/uL   Basophils Relative 0 %   Basophils Absolute 0.0 0.0 - 0.1 K/uL   Immature Granulocytes 1 %   Abs Immature Granulocytes 0.07 0.00 - 0.07 K/uL  Basic metabolic panel     Status: Abnormal   Collection Time: 02/06/22  1:08 AM  Result Value Ref Range   Sodium 135 135 - 145 mmol/L   Potassium 4.1 3.5 - 5.1 mmol/L   Chloride 104 98 - 111 mmol/L   CO2 23 22 - 32 mmol/L   Glucose, Bld 118 (H) 70 - 99 mg/dL   BUN 18 6 - 20 mg/dL   Creatinine, Ser 1.27 (H) 0.61 - 1.24 mg/dL   Calcium 9.2 8.9 - 10.3 mg/dL   GFR, Estimated >60 >60 mL/min   Anion gap 8 5 -  15  Urinalysis, Routine w reflex  microscopic Urine, Clean Catch     Status: Abnormal   Collection Time: 02/06/22  1:08 AM  Result Value Ref Range   Color, Urine YELLOW YELLOW   APPearance CLEAR CLEAR   Specific Gravity, Urine 1.010 1.005 - 1.030   pH 7.0 5.0 - 8.0   Glucose, UA NEGATIVE NEGATIVE mg/dL   Hgb urine dipstick TRACE (A) NEGATIVE   Bilirubin Urine NEGATIVE NEGATIVE   Ketones, ur NEGATIVE NEGATIVE mg/dL   Protein, ur NEGATIVE NEGATIVE mg/dL   Nitrite NEGATIVE NEGATIVE   Leukocytes,Ua NEGATIVE NEGATIVE  D-dimer, quantitative     Status: Abnormal   Collection Time: 02/06/22  1:08 AM  Result Value Ref Range   D-Dimer, Quant 3.06 (H) 0.00 - 0.50 ug/mL-FEU  Urinalysis, Microscopic (reflex)     Status: Abnormal   Collection Time: 02/06/22  1:08 AM  Result Value Ref Range   RBC / HPF 0-5 0 - 5 RBC/hpf   WBC, UA 0-5 0 - 5 WBC/hpf   Bacteria, UA RARE (A) NONE SEEN   Squamous Epithelial / LPF 0-5 0 - 5   Imaging Studies: No results found.  ED COURSE and MDM  Nursing notes, initial and subsequent vitals signs, including pulse oximetry, reviewed and interpreted by myself.  Vitals:   02/05/22 2324 02/05/22 2325 02/06/22 0100  BP: (!) 145/98  129/65  Pulse: (!) 106  99  Resp: (!) 22  20  Temp: 98.3 F (36.8 C)    TempSrc: Oral    SpO2: 97%  100%  Weight:  136.1 kg   Height:  5\' 10"  (1.778 m)    Medications  sodium chloride 0.9 % bolus 1,000 mL (0 mLs Intravenous Stopped 02/06/22 0146)  ondansetron (ZOFRAN) injection 4 mg (4 mg Intravenous Given 02/06/22 0104)  ketorolac (TORADOL) 15 MG/ML injection 15 mg (15 mg Intravenous Given 02/06/22 0105)  ceFAZolin (ANCEF) IVPB 1 g/50 mL premix (0 g Intravenous Stopped 02/06/22 0158)  enoxaparin (LOVENOX) injection 135 mg (135 mg Subcutaneous Given 02/06/22 0146)   1:26 AM Clinically the patient's right lower leg has cellulitis and we will start him on Ancef in the ED and send him home on oral antibiotics.  His D-dimer is elevated however so we will have him return  later this morning for Doppler ultrasound.  The [place] food may be responsible for his acute nausea and vomiting but I doubt it is related to his lower extremity swelling.   PROCEDURES  Procedures   ED DIAGNOSES     ICD-10-CM   1. Nausea and vomiting in adult  R11.2     2. Cellulitis of right lower leg  L03.115     3. Suspected DVT (deep vein thrombosis)  R09.89          Shanon Rosser, MD 02/06/22 [phone]

## 2023-02-17 ENCOUNTER — Emergency Department (HOSPITAL_BASED_OUTPATIENT_CLINIC_OR_DEPARTMENT_OTHER)
Admission: EM | Admit: 2023-02-17 | Discharge: 2023-02-17 | Disposition: A | Discharge status: Home / Self Care | Payer: Managed Care, Other (non HMO) | Attending: Emergency Medicine | Admitting: Emergency Medicine

## 2023-02-17 ENCOUNTER — Emergency Department (HOSPITAL_BASED_OUTPATIENT_CLINIC_OR_DEPARTMENT_OTHER): Payer: Managed Care, Other (non HMO)

## 2023-02-17 ENCOUNTER — Other Ambulatory Visit: Payer: Self-pay

## 2023-02-17 ENCOUNTER — Encounter (HOSPITAL_BASED_OUTPATIENT_CLINIC_OR_DEPARTMENT_OTHER): Payer: Self-pay | Admitting: Emergency Medicine

## 2023-02-17 DIAGNOSIS — M7989 Other specified soft tissue disorders: Secondary | ICD-10-CM | POA: Diagnosis present

## 2023-02-17 DIAGNOSIS — L03115 Cellulitis of right lower limb: Secondary | ICD-10-CM | POA: Insufficient documentation

## 2023-02-17 DIAGNOSIS — D72829 Elevated white blood cell count, unspecified: Secondary | ICD-10-CM | POA: Diagnosis not present

## 2023-02-17 DIAGNOSIS — R079 Chest pain, unspecified: Secondary | ICD-10-CM

## 2023-02-17 LAB — COMPREHENSIVE METABOLIC PANEL
ALT: 43 U/L (ref 0–44)
AST: 29 U/L (ref 15–41)
Albumin: 4.5 g/dL (ref 3.5–5.0)
Alkaline Phosphatase: 50 U/L (ref 38–126)
Anion gap: 12 (ref 5–15)
BUN: 15 mg/dL (ref 6–20)
CO2: 24 mmol/L (ref 22–32)
Calcium: 9 mg/dL (ref 8.9–10.3)
Chloride: 97 mmol/L — ABNORMAL LOW (ref 98–111)
Creatinine, Ser: 1.28 mg/dL — ABNORMAL HIGH (ref 0.61–1.24)
GFR, Estimated: >60 mL/min (ref >60)
Glucose, Bld: 97 mg/dL (ref 70–99)
Potassium: 3.8 mmol/L (ref 3.5–5.1)
Sodium: 133 mmol/L — ABNORMAL LOW (ref 135–145)
Total Bilirubin: 1.2 mg/dL (ref 0.3–1.2)
Total Protein: 7.7 g/dL (ref 6.5–8.1)

## 2023-02-17 LAB — CBC WITH DIFFERENTIAL/PLATELET
Abs Immature Granulocytes: 0.06 10*3/uL (ref 0.00–0.07)
Basophils Absolute: 0.1 10*3/uL (ref 0.0–0.1)
Basophils Relative: 0 %
Eosinophils Absolute: 0.1 10*3/uL (ref 0.0–0.5)
Eosinophils Relative: 1 %
HCT: 43.8 % (ref 39.0–52.0)
Hemoglobin: 15.6 g/dL (ref 13.0–17.0)
Immature Granulocytes: 1 %
Lymphocytes Relative: 9 %
Lymphs Abs: 1.1 10*3/uL (ref 0.7–4.0)
MCH: 29.9 pg (ref 26.0–34.0)
MCHC: 35.6 g/dL (ref 30.0–36.0)
MCV: 84.1 fL (ref 80.0–100.0)
Monocytes Absolute: 1.1 10*3/uL — ABNORMAL HIGH (ref 0.1–1.0)
Monocytes Relative: 9 %
Neutro Abs: 9.9 10*3/uL — ABNORMAL HIGH (ref 1.7–7.7)
Neutrophils Relative %: 80 %
Platelets: 170 10*3/uL (ref 150–400)
RBC: 5.21 MIL/uL (ref 4.22–5.81)
RDW: 12.8 % (ref 11.5–15.5)
WBC: 12.2 10*3/uL — ABNORMAL HIGH (ref 4.0–10.5)
nRBC: 0 % (ref 0.0–0.2)

## 2023-02-17 LAB — TROPONIN I (HIGH SENSITIVITY)
Troponin I (High Sensitivity): 3 ng/L (ref <18)
Troponin I (High Sensitivity): 3 ng/L (ref <18)

## 2023-02-17 LAB — GLUCOSE, CAPILLARY: Glucose-Capillary: 101 mg/dL — ABNORMAL HIGH (ref 70–99)

## 2023-02-17 MED ORDER — SODIUM CHLORIDE 0.9 % IV SOLN: INTRAVENOUS | Status: DC | PRN

## 2023-02-17 MED ORDER — IOHEXOL 350 MG/ML SOLN
100.0000 mL | Freq: Once | INTRAVENOUS | Status: AC | PRN
  Administered 2023-02-17: 100 mL via INTRAVENOUS

## 2023-02-17 MED ORDER — DOXYCYCLINE HYCLATE 50 MG PO CAPS: 50.0000 mg | ORAL_CAPSULE | Freq: Two times a day (BID) | ORAL | 0 refills | Status: DC

## 2023-02-17 MED ORDER — DOXYCYCLINE HYCLATE 100 MG PO CAPS: 100.0000 mg | ORAL_CAPSULE | Freq: Two times a day (BID) | ORAL | 0 refills | Status: DC

## 2023-02-17 NOTE — ED Notes (Signed)
Patient transported to CT 

## 2023-02-17 NOTE — Discharge Instructions (Addendum)
Take doxycycline 100 mg twice daily for a week for right leg cellulitis   We reviewed your lab work and your ultrasound and CT scan there is no blood clots and your heart enzyme test is normal  Follow-up with your doctor  Return to ER if you have worse chest pain or shortness of breath or leg swelling or redness

## 2023-02-17 NOTE — ED Provider Notes (Signed)
[place] [facility] Provider Note   CSN: [id number] Arrival date & time: 02/17/23  1608     History  Chief Complaint  Patient presents with   Leg Swelling    right    Stuart Taylor is a 52 y.o. male with history of right-sided LE cellulitis in 2023 presents with one day history of right-sided LE pain. Patient states that he became stressed at work yesterday, went home and took BP which was 170/100. Patient without history of HTN. Also endorsed HA and "feeling like my eyes were about to pop out". This resolved after a few hours and patient was normotensive this morning at home. However, patient started to experience right LE pain late last night and this morning was unable to consistently bear weight due to pain which prompted coming to ED.  While in ED, patient became light-headed, diaphoretic, and had near syncope while getting IV. Patient denies history of this in the past. Further denied fever, chills, chest pain/palpitations, SOB, cough, abdominal pain, or change in urinary/bowel habits.    Past Medical History:  Diagnosis Date   Anxiety and depression    History of chicken pox      Home Medications Prior to Admission medications   Medication Sig Start Date End Date Taking? Authorizing Provider  b complex vitamins capsule Take 1 capsule by mouth daily.    [provider]  buPROPion (WELLBUTRIN XL) 300 MG 24 hr tablet Take 1 tablet (300 mg total) by mouth daily. 11/12/19   Mliss Sax, MD  cefUROXime (CEFTIN) 500 MG tablet Take 1 tablet (500 mg total) by mouth 2 (two) times daily with a meal. 02/06/22   Molpus, John, MD  Cholecalciferol (VITAMIN D) 50 MCG (2000 UT) CAPS Take 2 daily 08/10/19   Mliss Sax, MD  ondansetron (ZOFRAN-ODT) 8 MG disintegrating tablet Take 1 tablet (8 mg total) by mouth every 8 (eight) hours as needed for nausea or vomiting. 02/06/22   Molpus, Jonny Ruiz, MD  phentermine (ADIPEX-P) 37.5 MG  tablet Take 1 tablet (37.5 mg total) by mouth daily before breakfast. 08/10/19   Mliss Sax, MD      Allergies    Poison oak extract [poison oak extract]    Review of Systems   Review of Systems  Physical Exam Updated Vital Signs BP 123/80   Pulse (!) 105   Temp 99.6 F (37.6 C) (Oral)   Resp 15   Wt (!) 154.2 kg   SpO2 99%   BMI 48.78 kg/m  Physical Exam Constitutional:      General: He is in acute distress.     Appearance: He is obese. He is diaphoretic.  Cardiovascular:     Rate and Rhythm: Normal rate and regular rhythm.     Heart sounds: No murmur heard.    No friction rub. No gallop.  Pulmonary:     Effort: Pulmonary effort is normal.     Breath sounds: Normal breath sounds.  Abdominal:     Palpations: Abdomen is soft.     Tenderness: There is no abdominal tenderness.  Musculoskeletal:        General: Tenderness present.     Right lower leg: Swelling present.  Skin:    General: Skin is warm.     Findings: Erythema present.     Comments: RLE ill-defined erythema from ankle to mid shin  Neurological:     General: No focal deficit present.     Mental  Status: He is alert and oriented to person, place, and time.     ED Results / Procedures / Treatments   Labs (all labs ordered are listed, but only abnormal results are displayed) Labs Reviewed  CBC WITH DIFFERENTIAL/PLATELET - Abnormal; Notable for the following components:      Result Value   WBC 12.2 (*)    Neutro Abs 9.9 (*)    Monocytes Absolute 1.1 (*)    All other components within normal limits  COMPREHENSIVE METABOLIC PANEL - Abnormal; Notable for the following components:   Sodium 133 (*)    Chloride 97 (*)    Creatinine, Ser 1.28 (*)    All other components within normal limits  GLUCOSE, CAPILLARY - Abnormal; Notable for the following components:   Glucose-Capillary 101 (*)    All other components within normal limits  TROPONIN I (HIGH SENSITIVITY)  TROPONIN I (HIGH SENSITIVITY)     EKG EKG Interpretation Date/Time:  Thursday February 17 2023 16:44:50 EDT Ventricular Rate:  108 PR Interval:  167 QRS Duration:  104 QT Interval:  313 QTC Calculation: 420 R Axis:   42  Text Interpretation: Sinus tachycardia Low voltage, precordial leads Baseline wander in lead(s) V2 Partial missing lead(s): V2 No previous ECGs available Confirmed by Richardean Canal [phone]) on 02/17/2023 5:03:01 PM  Radiology US Venous Img Lower Unilateral Right (DVT)  Result Date: 02/17/2023 CLINICAL DATA:  Right leg swelling EXAM: RIGHT LOWER EXTREMITY VENOUS DOPPLER ULTRASOUND TECHNIQUE: Gray-scale sonography with compression, as well as color and duplex ultrasound, were performed to evaluate the deep venous system(s) from the level of the common femoral vein through the popliteal and proximal calf veins. COMPARISON:  None Available. FINDINGS: VENOUS Normal compressibility of the common femoral, superficial femoral, and popliteal veins, as well as the visualized calf veins. Visualized portions of profunda femoral vein and great saphenous vein unremarkable. No filling defects to suggest DVT on grayscale or color Doppler imaging. Doppler waveforms show normal direction of venous flow, normal respiratory plasticity and response to augmentation. Limited views of the contralateral common femoral vein are unremarkable. OTHER Multiple prominent right inguinal lymph nodes, the largest measuring 4.1 x 1.9 x 1.1 cm. Limitations: none IMPRESSION: No evidence of right lower extremity DVT. Prominent right inguinal lymph nodes, the largest having a short axis diameter of 1.1 cm. These could be followed clinically to ensure resolution. Electronically Signed   By: Charlett Nose M.D.   On: 02/17/2023 19:02   DG Chest Port 1 View  Result Date: 02/17/2023 CLINICAL DATA:  Shortness of breath, chest pain EXAM: PORTABLE CHEST 1 VIEW COMPARISON:  None Available. FINDINGS: Heart and mediastinal contours are within normal limits. No  focal opacities or effusions. No acute bony abnormality. IMPRESSION: No active disease. Electronically Signed   By: Charlett Nose M.D.   On: 02/17/2023 19:00    Procedures Procedures    Medications Ordered in ED Medications  0.9 %  sodium chloride infusion (0 mLs Intravenous Paused 02/17/23 1923)  iohexol (OMNIPAQUE) 350 MG/ML injection 100 mL (100 mLs Intravenous Contrast Given 02/17/23 1928)    ED Course/ Medical Decision Making/ A&P                                 Medical Decision Making Amount and/or Complexity of Data Reviewed Labs: ordered. Radiology: ordered.  Risk Prescription drug management.   52 year old presents with RLE pain and in the process of worked-up  became light-headed, diaphoretic, and had near-syncope while sitting down obtaining IV. This is most likely attributable to vasovagal response given prodrome. Low concern for cardiac etiology given unremarkable telemetry/delta troponin and stable vitals. Cannot rule out orthostatic although patient sitting down during symptom onset so this is less likely. CMP unremarkable. In terms of RLE pain, erythema, and swelling, initial concern was for DVT/PE but doppler of RLE did not show DVT and symptoms resolved after a few minutes. Patient has unilateral RLE swelling with ill-defined erythema extending from ankle to mid shin as well as marked tenderness to palpitation. Patient with history of RLE cellulitis 01/2022 with picture from this encounter looking very similar to presentation today. Patient also with leukocytosis of 12.2. Findings consistent with RLE cellulitis and patient discharged with Doxycycline 100 mg BID for 7 days. Stable for discharge home.        Final Clinical Impression(s) / ED Diagnoses Final diagnoses:  None    Rx / DC Orders ED Discharge Orders     None         Carmina Miller, DO 02/17/23 2324    Charlynne Pander, MD 02/17/23 2326

## 2023-02-17 NOTE — ED Triage Notes (Addendum)
Right leg edema started last night , shortness of breath and chest pain yesterday , reports was overly stressed yesterday at work , high BP yesterday .  No Hx DVT . Hx same symptoms .  Redness noted , reports pain worse while standing

## 2023-02-17 NOTE — ED Notes (Signed)
Pt ambulated to the restroom with standby assistance - endorses discomfort to the R ankle when walking.

## 2023-02-17 NOTE — ED Notes (Signed)
Ultrasound at bedside

## 2023-05-24 ENCOUNTER — Encounter: Payer: Self-pay | Admitting: Physician Assistant

## 2023-05-24 ENCOUNTER — Ambulatory Visit (INDEPENDENT_AMBULATORY_CARE_PROVIDER_SITE_OTHER): Payer: No Typology Code available for payment source | Admitting: Physician Assistant

## 2023-05-24 VITALS — BP 136/92 | HR 90 | Temp 98.2°F | Ht 70.0 in | Wt 323.4 lb

## 2023-05-24 DIAGNOSIS — F32A Depression, unspecified: Secondary | ICD-10-CM

## 2023-05-24 DIAGNOSIS — F419 Anxiety disorder, unspecified: Secondary | ICD-10-CM

## 2023-05-24 DIAGNOSIS — F909 Attention-deficit hyperactivity disorder, unspecified type: Secondary | ICD-10-CM | POA: Diagnosis not present

## 2023-05-24 DIAGNOSIS — R030 Elevated blood-pressure reading, without diagnosis of hypertension: Secondary | ICD-10-CM

## 2023-05-24 DIAGNOSIS — Z1211 Encounter for screening for malignant neoplasm of colon: Secondary | ICD-10-CM

## 2023-05-24 NOTE — Progress Notes (Signed)
 New patient visit   Patient: Stuart Taylor   DOB: 27-Sep-1970   54 y.o. Male  MRN: [id number] Visit Date: 05/24/2023  Today's healthcare provider: Manuelita Flatness, PA-C   Chief Complaint  Patient presents with   Establish Care    Patient needing PCP and someone to continue adderall    Subjective    Stuart Taylor is a 53 y.o. male who presents today as a new patient to establish care.   Discussed the use of AI scribe software for clinical note transcription with the patient, who gave verbal consent to proceed.  History of Present Illness   Stuart Taylor, a patient with a history of ADHD, presents for a new patient visit seeking a primary care provider for regular health maintenance and medication management. He is currently on Adderall 20mg , a recent increase from 15mg . He reports that the higher dose was initially concerning due to potential blood pressure issues, but he believes the elevated blood pressure readings may have been due to stress from his previous job or inaccurate readings from a wrist cuff. He has not taken Adderall for about a week and reports feeling down, which he attributes to the stress and depression from recent job loss. He also expresses interest in weight loss medication, specifically mentioning Zepbound, but is unsure about insurance coverage. He mentions a need for a colonoscopy and expresses concern about potential costs.       Past Medical History:  Diagnosis Date   Anxiety and depression    History of chicken pox    Past Surgical History:  Procedure Laterality Date   APPENDECTOMY  11/2016   EYE SURGERY     x4, [3]Left, [2] Right   HERNIA REPAIR     I & D EXTREMITY Left 12/16/2014   Procedure: IRRIGATION AND DEBRIDEMENT EXTREMITY;  Surgeon: Prentice Pagan, MD;  Location: [facility] OR;  Service: Orthopedics;  Laterality: Left;   TONSILLECTOMY     Family Status  Relation Name Status   Mother  Alive   Father  Deceased   Sister  (Not Specified)   Neg Hx   (Not Specified)  No partnership data on file   Family History  Problem Relation Age of Onset   COPD Mother        Living   Anxiety disorder Mother    GER disease Mother    Congestive Heart Failure Mother    Sleep apnea Mother    Arthritis Mother    Kidney disease Mother    Hypothyroidism Mother    Hyperlipidemia Mother    Pulmonary embolism Mother    Anemia Mother    Asthma Mother    Heart attack Father 40       Deceased   Deep vein thrombosis Father    Cancer Father 58       Ureteral   Allergic rhinitis Father    Mental illness Sister    Eczema Neg Hx    Urticaria Neg Hx    Immunodeficiency Neg Hx    Angioedema Neg Hx    Social History   Socioeconomic History   Marital status: Single    Spouse name: Not on file   Number of children: Not on file   Years of education: Not on file   Highest education level: Not on file  Occupational History   Not on file  Tobacco Use   Smoking status: Former    Current packs/day: 0.00    Average packs/day: 0.5 packs/day for 7.0  years (3.5 ttl pk-yrs)    Types: Cigarettes    Start date: 04/19/2009    Quit date: 04/19/2016    Years since quitting: 7.1   Smokeless tobacco: Never  Vaping Use   Vaping status: Never Used  Substance and Sexual Activity   Alcohol use: Yes    Alcohol/week: 1.0 standard drink of alcohol    Types: 1 Glasses of wine per week    Comment: occa   Drug use: No   Sexual activity: Not Currently  Other Topics Concern   Not on file  Social History Narrative   Not on file   Social Drivers of Health   Financial Resource Strain: Low Risk  (09/09/2022)   Received from [facility]   Overall Financial Resource Strain (CARDIA)    Difficulty of Paying Living Expenses: Not hard at all  Food Insecurity: No Food Insecurity (09/09/2022)   Received from [facility]   Hunger Vital Sign    Worried About Running Out of Food in the Last Year: Never true    Ran Out of Food in the Last Year: Never true   Transportation Needs: No Transportation Needs (09/09/2022)   Received from [facility] - Transportation    Lack of Transportation (Medical): No    Lack of Transportation (Non-Medical): No  Physical Activity: Insufficiently Active (08/22/2021)   Received from [facility]   Exercise Vital Sign    Days of Exercise per Week: 2 days    Minutes of Exercise per Session: 10 min  Stress: Stress Concern Present (08/22/2021)   Received from [facility] - Occupational Stress Questionnaire    Feeling of Stress : To some extent  Social Connections: Moderately Integrated (03/09/2022)   Received from [facility]   Social Network    How would you rate your social network (family, work, friends)?: Adequate participation with social networks   Outpatient Medications Prior to Visit  Medication Sig   amphetamine -dextroamphetamine (ADDERALL XR) 20 MG 24 hr capsule Take 20 mg by mouth every morning.   [DISCONTINUED] b complex vitamins capsule Take 1 capsule by mouth daily.   [DISCONTINUED] buPROPion  (WELLBUTRIN  XL) 300 MG 24 hr tablet Take 1 tablet (300 mg total) by mouth daily.   [DISCONTINUED] cefUROXime  (CEFTIN ) 500 MG tablet Take 1 tablet (500 mg total) by mouth 2 (two) times daily with a meal.   [DISCONTINUED] Cholecalciferol (VITAMIN D ) 50 MCG (2000 UT) CAPS Take 2 daily   [DISCONTINUED] doxycycline  (VIBRAMYCIN ) 100 MG capsule Take 1 capsule (100 mg total) by mouth 2 (two) times daily. One po bid x 7 days   [DISCONTINUED] ondansetron  (ZOFRAN -ODT) 8 MG disintegrating tablet Take 1 tablet (8 mg total) by mouth every 8 (eight) hours as needed for nausea or vomiting.   [DISCONTINUED] phentermine  (ADIPEX-P ) 37.5 MG tablet Take 1 tablet (37.5 mg total) by mouth daily before breakfast.   No facility-administered medications prior to visit.   Allergies  Allergen Reactions   Poison Oak Extract [Poison Oak Extract]  Anaphylaxis, Swelling and Rash    Immunization History  Administered Date(s) Administered   Influenza Inj Mdck Quad Pf 03/10/2022   Influenza, Mdck, Trivalent,PF 6+ MOS(egg free) 03/16/2023   Influenza,inj,Quad PF,6+ Mos 02/19/2015, 06/10/2016, 04/27/2017, 07/04/2018, 02/09/2019, 07/12/2019   Influenza-Unspecified 04/16/2014, 02/19/2015, 02/19/2015, 06/10/2016, 06/10/2016, 04/27/2017, 04/27/2017, 07/12/2019, 08/18/2020, 02/23/2021   PFIZER(Purple Top)SARS-COV-2 Vaccination 08/07/2019, 08/27/2019, 08/16/2020   Pfizer Covid-19 Vaccine Bivalent Booster 81yrs & up 02/23/2021  Td 07/22/2016   Zoster Recombinant(Shingrix) 04/23/2022, 08/06/2022    Health Maintenance  Topic Date Due   Colonoscopy  Never done   COVID-19 Vaccine (5 - 2024-25 season) 01/16/2023   DTaP/Tdap/Td (2 - Tdap) 07/23/2026   INFLUENZA VACCINE  Completed   Hepatitis C Screening  Completed   HIV Screening  Completed   Zoster Vaccines- Shingrix  Completed   HPV VACCINES  Aged Out    Patient Care Team: Cyndi Shaver, PA-C as PCP - General (Physician Assistant)  Review of Systems  Constitutional:  Negative for fatigue and fever.  Respiratory:  Negative for cough and shortness of breath.   Cardiovascular:  Negative for chest pain, palpitations and leg swelling.  Neurological:  Negative for dizziness and headaches.        Objective    BP (!) 136/92   Pulse 90   Temp 98.2 F (36.8 C) (Oral)   Ht 5' 10 (1.778 m)   Wt (!) 323 lb 6 oz (146.7 kg)   SpO2 94%   BMI 46.40 kg/m    Physical Exam Constitutional:      General: He is awake.     Appearance: He is well-developed.  HENT:     Head: Normocephalic.  Eyes:     Conjunctiva/sclera: Conjunctivae normal.  Cardiovascular:     Rate and Rhythm: Normal rate and regular rhythm.     Heart sounds: Normal heart sounds.  Pulmonary:     Effort: Pulmonary effort is normal. No respiratory distress.  Skin:    General: Skin is warm.  Neurological:      Mental Status: He is alert and oriented to person, place, and time.  Psychiatric:        Attention and Perception: Attention normal.        Mood and Affect: Mood normal.        Speech: Speech normal.        Behavior: Behavior is cooperative.     Depression Screen    05/24/2023    4:10 PM 11/12/2019    9:33 AM 08/10/2019   10:20 AM 07/12/2019   11:27 AM  PHQ 2/9 Scores  PHQ - 2 Score 4 1 2 3   PHQ- 9 Score 15 2 10 14    No results found for any visits on 05/24/23.  Assessment & Plan     Attention deficit hyperactivity disorder (ADHD), unspecified ADHD type Assessment & Plan: Long-standing ADHD, managed with Adderall 20 mg.Concerns about elevated blood pressure with higher dose.  -Order urine drug screen - Have patient sign controlled medication contract - Monitor blood pressure closely - Follow up in one month to reassess blood pressure and Adderall efficacy  Orders: -     Drug Monitoring Panel [phone] , Urine -     Ambulatory referral to Psychology  Elevated blood pressure reading in office without diagnosis of hypertension Assessment & Plan: Considering today the patient's baseline BP given ~ 1 week without adderall.  Advised his BP is still elevated at baseline. He is anxious at the visit today.  No meds advised today, but recommend pt check at home and f/b in 1 mo.    Anxiety and depression Assessment & Plan: Referred to therapy   Orders: -     Ambulatory referral to Psychology  Colon cancer screening -     Ambulatory referral to Gastroenterology    General Health Maintenance Due for colonoscopy. - Refer for colonoscopy    Return in about 4 weeks (around 06/21/2023) for hypertension,  adhdSABRA Manuelita Flatness, PA-C  [facility] [phone] (phone) [phone] (fax)  [facility]

## 2023-05-25 ENCOUNTER — Encounter: Payer: Self-pay | Admitting: Physician Assistant

## 2023-05-25 DIAGNOSIS — I10 Essential (primary) hypertension: Secondary | ICD-10-CM | POA: Insufficient documentation

## 2023-05-25 DIAGNOSIS — F909 Attention-deficit hyperactivity disorder, unspecified type: Secondary | ICD-10-CM | POA: Insufficient documentation

## 2023-05-25 DIAGNOSIS — R030 Elevated blood-pressure reading, without diagnosis of hypertension: Secondary | ICD-10-CM | POA: Insufficient documentation

## 2023-05-25 NOTE — Assessment & Plan Note (Signed)
Referred to therapy

## 2023-05-25 NOTE — Assessment & Plan Note (Signed)
 Long-standing ADHD, managed with Adderall 20 mg.Concerns about elevated blood pressure with higher dose.  -Order urine drug screen - Have patient sign controlled medication contract - Monitor blood pressure closely - Follow up in one month to reassess blood pressure and Adderall efficacy

## 2023-05-25 NOTE — Assessment & Plan Note (Signed)
 Considering today the patient's baseline BP given ~ 1 week without adderall.  Advised his BP is still elevated at baseline. He is anxious at the visit today.  No meds advised today, but recommend pt check at home and f/b in 1 mo.

## 2023-05-27 ENCOUNTER — Other Ambulatory Visit: Payer: Self-pay | Admitting: Physician Assistant

## 2023-05-27 DIAGNOSIS — F909 Attention-deficit hyperactivity disorder, unspecified type: Secondary | ICD-10-CM

## 2023-05-27 LAB — DRUG MONITORING PANEL 376104, URINE
Amphetamines: NEGATIVE ng/mL (ref <500)
Barbiturates: NEGATIVE ng/mL (ref <300)
Benzodiazepines: NEGATIVE ng/mL (ref <100)
Cocaine Metabolite: NEGATIVE ng/mL (ref <150)
Desmethyltramadol: NEGATIVE ng/mL (ref <100)
Opiates: NEGATIVE ng/mL (ref <100)
Oxycodone: NEGATIVE ng/mL (ref <100)
Tramadol: NEGATIVE ng/mL (ref <100)

## 2023-05-27 LAB — DM TEMPLATE

## 2023-05-27 MED ORDER — AMPHETAMINE-DEXTROAMPHET ER 20 MG PO CP24
20.0000 mg | ORAL_CAPSULE | Freq: Every morning | ORAL | 0 refills | Status: DC
Start: 2023-05-27 — End: 2023-06-07

## 2023-05-31 ENCOUNTER — Telehealth: Payer: Self-pay

## 2023-05-31 ENCOUNTER — Encounter: Payer: Self-pay | Admitting: Physician Assistant

## 2023-05-31 NOTE — Telephone Encounter (Signed)
 PA initiated via Covermymeds; [id number]. Awaiting determination.

## 2023-06-01 NOTE — Telephone Encounter (Addendum)
 PA denied. Awaiting denial information.

## 2023-06-06 NOTE — Telephone Encounter (Signed)
Please advise 

## 2023-06-06 NOTE — Telephone Encounter (Signed)
Copied from CRM [id number]. Topic: Clinical - Prescription Issue >> Jun 06, 2023  4:18 PM Isabell A wrote: Reason for CRM: Patient calling in regard to his medication amphetamine-dextroamphetamine (ADDERALL XR) 20 MG 24 hr capsule, states he can get it cheaper with [facility] at [facility]. Would like to speak with MA or RN in regard to this.

## 2023-06-06 NOTE — Telephone Encounter (Signed)
PA denial received.  Members of 51 years of age and older must meet the following criteria:   Per plan- doctor must tell us:   -the diagnostic and statistical manual of mental disorders V (DSM-5) criteria for diagnosis of ADHD has been met  -behavorial modification techniques have been tried prior to the drug being prescribed

## 2023-06-07 ENCOUNTER — Other Ambulatory Visit: Payer: Self-pay | Admitting: Physician Assistant

## 2023-06-07 DIAGNOSIS — F909 Attention-deficit hyperactivity disorder, unspecified type: Secondary | ICD-10-CM

## 2023-06-07 MED ORDER — AMPHETAMINE-DEXTROAMPHET ER 20 MG PO CP24
20.0000 mg | ORAL_CAPSULE | Freq: Every morning | ORAL | 0 refills | Status: DC
Start: 2023-06-07 — End: 2023-07-13

## 2023-06-07 NOTE — Telephone Encounter (Signed)
[facility] in [place]-

## 2023-06-09 NOTE — Telephone Encounter (Signed)
Copied from CRM [id number]. Topic: Clinical - Medication Question >> Jun 09, 2023  2:42 PM Corin V wrote: Reason for CRM: Patient called for update on Adderall prior authorization. Please call back with update by end of day

## 2023-06-10 NOTE — Telephone Encounter (Signed)
Returned call and had to LVM for patient to call us back.  PA has been denied-   PA denial received.  Members of 52 years of age and older must meet the following criteria:    Per plan- doctor must tell us:              -the diagnostic and statistical manual of mental disorders V (DSM-5) criteria for diagnosis of ADHD has been met             -behavorial modification techniques have been tried prior to the drug being prescribed

## 2023-06-14 NOTE — Telephone Encounter (Signed)
Patient called and asked for [place] to send a record of release by email so we can get a copy of -the diagnostic and statistical manual of mental disorders V from therapist

## 2023-06-24 ENCOUNTER — Encounter: Payer: Self-pay | Admitting: Physician Assistant

## 2023-06-24 ENCOUNTER — Ambulatory Visit (INDEPENDENT_AMBULATORY_CARE_PROVIDER_SITE_OTHER): Payer: No Typology Code available for payment source | Admitting: Physician Assistant

## 2023-06-24 VITALS — BP 140/100 | HR 107 | Temp 98.6°F | Ht 70.0 in | Wt 326.4 lb

## 2023-06-24 DIAGNOSIS — I10 Essential (primary) hypertension: Secondary | ICD-10-CM | POA: Diagnosis not present

## 2023-06-24 DIAGNOSIS — F909 Attention-deficit hyperactivity disorder, unspecified type: Secondary | ICD-10-CM | POA: Diagnosis not present

## 2023-06-24 DIAGNOSIS — F32A Depression, unspecified: Secondary | ICD-10-CM

## 2023-06-24 DIAGNOSIS — F419 Anxiety disorder, unspecified: Secondary | ICD-10-CM

## 2023-06-24 MED ORDER — LOSARTAN POTASSIUM 25 MG PO TABS
25.0000 mg | ORAL_TABLET | Freq: Every day | ORAL | 1 refills | Status: AC
Start: 2023-06-24 — End: ?

## 2023-06-24 MED ORDER — BUPROPION HCL ER (XL) 150 MG PO TB24
150.0000 mg | ORAL_TABLET | Freq: Every day | ORAL | 1 refills | Status: DC
Start: 2023-06-24 — End: 2023-07-26

## 2023-06-24 NOTE — Patient Instructions (Addendum)
 [facility]- [phone]  [facility]- [phone] [facility]- [phone] [facility]- [phone] [facility]- [phone]

## 2023-06-24 NOTE — Assessment & Plan Note (Signed)
 Stable on adderall 20 mg xr dosing

## 2023-06-24 NOTE — Progress Notes (Addendum)
      Established patient visit   Patient: Stuart Taylor   DOB: 1970/08/11   53 y.o. Male  MRN: [id number] Visit Date: 06/24/2023  Today's healthcare provider: Manuelita Flatness, PA-C   Cc. Blood pressure, adhd  Subjective     Pt presents today for BP check, after re-starting his adderall for ADHD.   He has questions today, interested in seeing a therapist, and starting something for depression. Reports previous success with wellbutrin .  He comments that effexor  was one that did not work for him.  Medications: Outpatient Medications Prior to Visit  Medication Sig   amphetamine -dextroamphetamine (ADDERALL XR) 20 MG 24 hr capsule Take 1 capsule (20 mg total) by mouth every morning.   No facility-administered medications prior to visit.    Review of Systems  Constitutional:  Negative for fatigue and fever.  Respiratory:  Negative for cough and shortness of breath.   Cardiovascular:  Negative for chest pain, palpitations and leg swelling.  Neurological:  Negative for dizziness and headaches.  Psychiatric/Behavioral:  Positive for decreased concentration. The patient is nervous/anxious.        Objective    BP (!) 140/100   Pulse (!) 107   Temp 98.6 F (37 C) (Oral)   Ht 5' 10 (1.778 m)   Wt (!) 326 lb 6 oz (148 kg)   SpO2 95%   BMI 46.83 kg/m    Physical Exam Vitals reviewed.  Constitutional:      Appearance: He is not ill-appearing.  HENT:     Head: Normocephalic.  Eyes:     Conjunctiva/sclera: Conjunctivae normal.  Cardiovascular:     Rate and Rhythm: Normal rate.  Pulmonary:     Effort: Pulmonary effort is normal. No respiratory distress.  Neurological:     General: No focal deficit present.     Mental Status: He is alert and oriented to person, place, and time.  Psychiatric:        Mood and Affect: Mood normal.        Behavior: Behavior normal.     No results found for any visits on 06/24/23.  Assessment & Plan    Primary hypertension Assessment  & Plan: Still elevated in office Benefit to adderall and no real change before/after starting  Recommending starting losartan  25 mg daily, f/u 1 mo  Orders: -     Losartan  Potassium; Take 1 tablet (25 mg total) by mouth daily.  Dispense: 90 tablet; Refill: 1  Attention deficit hyperactivity disorder (ADHD), unspecified ADHD type Assessment & Plan: Stable on adderall 20 mg xr dosing   Anxiety and depression Assessment & Plan: Given new referrals to therapy Starting on wellbutrin  150 mg daily, can increase to 300 mg daily after a week F/b in 4 weeks  Orders: -     buPROPion  HCl ER (XL); Take 1 tablet (150 mg total) by mouth daily. For a week, and then take 300 mg daily.  Dispense: 60 tablet; Refill: 1    Return in about 4 weeks (around 07/22/2023) for hypertension; adhd.       Manuelita Flatness, PA-C  [facility] [phone] (phone) [phone] (fax)  [facility]

## 2023-06-24 NOTE — Assessment & Plan Note (Addendum)
 Still elevated in office Benefit to adderall and no real change before/after starting  Recommending starting losartan  25 mg daily, f/u 1 mo

## 2023-06-24 NOTE — Assessment & Plan Note (Signed)
 Given new referrals to therapy Starting on wellbutrin  150 mg daily, can increase to 300 mg daily after a week F/b in 4 weeks

## 2023-07-10 ENCOUNTER — Encounter: Payer: Self-pay | Admitting: Physician Assistant

## 2023-07-13 ENCOUNTER — Other Ambulatory Visit: Payer: Self-pay | Admitting: Physician Assistant

## 2023-07-13 ENCOUNTER — Encounter: Payer: Self-pay | Admitting: Gastroenterology

## 2023-07-13 DIAGNOSIS — F909 Attention-deficit hyperactivity disorder, unspecified type: Secondary | ICD-10-CM

## 2023-07-13 MED ORDER — AMPHETAMINE-DEXTROAMPHET ER 15 MG PO CP24
15.0000 mg | ORAL_CAPSULE | ORAL | 0 refills | Status: DC
Start: 2023-07-13 — End: 2023-08-02

## 2023-07-19 ENCOUNTER — Ambulatory Visit (AMBULATORY_SURGERY_CENTER): Payer: No Typology Code available for payment source

## 2023-07-19 VITALS — Ht 70.0 in | Wt 330.0 lb

## 2023-07-19 DIAGNOSIS — Z1211 Encounter for screening for malignant neoplasm of colon: Secondary | ICD-10-CM

## 2023-07-19 MED ORDER — SUFLAVE 178.7 G PO SOLR: 1.0000 | Freq: Once | ORAL | 0 refills | Status: AC

## 2023-07-19 NOTE — Progress Notes (Signed)
 Pre visit completed via phone call; Patient verified name, DOB, and address; No egg or soy allergy known to patient;  No issues known to pt with past sedation with any surgeries or procedures; Patient denies ever being told they had issues or difficulty with intubation;  No FH of Malignant Hyperthermia; Pt is not on diet pills; Pt is not on home 02;  Pt is not on blood thinners;  Pt denies issues with constipation;  No A fib or A flutter; Have any cardiac testing pending--NO Insurance verified during PV appt--- [employer] Next Pt can ambulate without assistance;  Pt denies use of chewing tobacco; Discussed diabetic/weight loss medication holds; Discussed NSAID holds; Checked BMI to be less than 50; Pt instructed to use [employer] or GoodRx for a price reduction on prep;   Pre visit completed and red dot placed by patient's name on their procedure day (on provider's schedule);   Instructions sent to MyChart as well as printed for the patient during PV appt;

## 2023-07-22 ENCOUNTER — Ambulatory Visit: Payer: No Typology Code available for payment source | Admitting: Physician Assistant

## 2023-07-22 NOTE — Progress Notes (Deleted)
      Established patient visit   Patient: Stuart Taylor   DOB: 1970-11-27   53 y.o. Male  MRN: [id number] Visit Date: 07/22/2023  Today's healthcare provider: Alfredia Ferguson, PA-C   No chief complaint on file.  Subjective     ***  Medications: Outpatient Medications Prior to Visit  Medication Sig   amphetamine-dextroamphetamine (ADDERALL XR) 15 MG 24 hr capsule Take 1 capsule by mouth every morning.   betamethasone valerate ointment (VALISONE) 0.1 % Apply 1 Application topically daily as needed.   buPROPion (WELLBUTRIN XL) 150 MG 24 hr tablet Take 1 tablet (150 mg total) by mouth daily. For a week, and then take 300 mg daily. (Patient taking differently: Take 300 mg by mouth daily.)   clobetasol cream (TEMOVATE) 0.05 % Apply 1 Application topically daily as needed.   fexofenadine (ALLEGRA) 180 MG tablet Take 180 mg by mouth daily.   hydrocortisone 2.5 % cream Apply 1 Application topically daily as needed.   losartan (COZAAR) 25 MG tablet Take 1 tablet (25 mg total) by mouth daily.   Multiple Vitamins-Minerals (MULTIVITAMIN WITH MINERALS) tablet Take 1 tablet by mouth daily.   No facility-administered medications prior to visit.    Review of Systems {Insert previous labs (optional):23779} {See past labs  Heme  Chem  Endocrine  Serology  Results Review (optional):1}   Objective    There were no vitals taken for this visit. {Insert last BP/Wt (optional):23777}{See vitals history (optional):1}  Physical Exam  ***  No results found for any visits on 07/22/23.  Assessment & Plan    There are no diagnoses linked to this encounter.  ***  No follow-ups on file.       Alfredia Ferguson, PA-C  [facility] at [facility] [phone] (phone) [phone] (fax)  [facility]

## 2023-07-26 ENCOUNTER — Ambulatory Visit (INDEPENDENT_AMBULATORY_CARE_PROVIDER_SITE_OTHER): Admitting: Physician Assistant

## 2023-07-26 ENCOUNTER — Encounter: Payer: Self-pay | Admitting: Physician Assistant

## 2023-07-26 VITALS — BP 133/80 | HR 74 | Temp 98.1°F | Resp 16 | Ht 69.0 in | Wt 327.0 lb

## 2023-07-26 DIAGNOSIS — I10 Essential (primary) hypertension: Secondary | ICD-10-CM | POA: Diagnosis not present

## 2023-07-26 DIAGNOSIS — F32A Depression, unspecified: Secondary | ICD-10-CM

## 2023-07-26 DIAGNOSIS — F419 Anxiety disorder, unspecified: Secondary | ICD-10-CM | POA: Diagnosis not present

## 2023-07-26 MED ORDER — BUPROPION HCL ER (XL) 300 MG PO TB24
300.0000 mg | ORAL_TABLET | Freq: Every day | ORAL | 1 refills | Status: DC
Start: 2023-07-26 — End: 2024-03-10

## 2023-07-26 NOTE — Assessment & Plan Note (Signed)
 Tolerating wellbutrin 300 mg well refilled

## 2023-07-26 NOTE — Assessment & Plan Note (Addendum)
 Last visit started losartan 25 mg daily  Now more appropriately controlled  Cont as prescribed, f/u 3 mo

## 2023-07-26 NOTE — Progress Notes (Signed)
 Established patient visit   Patient: Stuart Taylor   DOB: [birth year]   53 y.o. Male  MRN: [id number] Visit Date: 07/26/2023  Today's healthcare provider: Alfredia Ferguson, PA-C   Cc. Htn f/u  Subjective     Hypertension, follow-up  BP Readings from Last 3 Encounters:  07/26/23 133/80  06/24/23 (!) 140/100  05/24/23 (!) 136/92   Wt Readings from Last 3 Encounters:  07/26/23 (!) 327 lb (148.3 kg)  07/19/23 (!) 330 lb (149.7 kg)  06/24/23 (!) 326 lb 6 oz (148 kg)     Last visit, around 5 weeks ago, we started losartan 25 mg .  Outside blood pressures are not being checked.   Pertinent labs Lab Results  Component Value Date   CHOL 157 07/12/2019   HDL 30.00 (L) 07/12/2019   LDLCALC 101 (H) 07/12/2019   LDLDIRECT 111.0 07/12/2019   TRIG 129.0 07/12/2019   CHOLHDL 5 07/12/2019   Lab Results  Component Value Date   NA 133 (L) 02/17/2023   K 3.8 02/17/2023   CREATININE 1.28 (H) 02/17/2023   GFRNONAA >60 02/17/2023   GLUCOSE 97 02/17/2023   TSH 1.68 11/16/2017     The 10-year ASCVD risk score (Arnett DK, et al., 2019) is: 5.4%  ---------------------------------------------------------------------------------------------------   Medications: Outpatient Medications Prior to Visit  Medication Sig   amphetamine-dextroamphetamine (ADDERALL XR) 15 MG 24 hr capsule Take 1 capsule by mouth every morning.   betamethasone valerate ointment (VALISONE) 0.1 % Apply 1 Application topically daily as needed.   clobetasol cream (TEMOVATE) 0.05 % Apply 1 Application topically daily as needed.   fexofenadine (ALLEGRA) 180 MG tablet Take 180 mg by mouth daily.   hydrocortisone 2.5 % cream Apply 1 Application topically daily as needed.   losartan (COZAAR) 25 MG tablet Take 1 tablet (25 mg total) by mouth daily.   Multiple Vitamins-Minerals (MULTIVITAMIN WITH MINERALS) tablet Take 1 tablet by mouth daily.   [DISCONTINUED] buPROPion (WELLBUTRIN XL) 150 MG 24 hr tablet Take 1  tablet (150 mg total) by mouth daily. For a week, and then take 300 mg daily. (Patient taking differently: Take 300 mg by mouth daily.)   No facility-administered medications prior to visit.    Review of Systems  Constitutional:  Negative for fatigue and fever.  Respiratory:  Negative for cough and shortness of breath.   Cardiovascular:  Negative for chest pain, palpitations and leg swelling.  Neurological:  Negative for dizziness and headaches.       Objective    BP 133/80 (BP Location: Left Arm, Patient Position: Sitting, Cuff Size: Large)   Pulse 74   Temp 98.1 F (36.7 C) (Oral)   Resp 16   Ht 5\' 9"  (1.753 m)   Wt (!) 327 lb (148.3 kg)   SpO2 96%   BMI 48.29 kg/m    Physical Exam Constitutional:      General: He is awake.     Appearance: He is well-developed.  HENT:     Head: Normocephalic.  Eyes:     Conjunctiva/sclera: Conjunctivae normal.  Cardiovascular:     Rate and Rhythm: Normal rate and regular rhythm.     Heart sounds: Normal heart sounds.  Pulmonary:     Effort: Pulmonary effort is normal.  Skin:    General: Skin is warm.  Neurological:     Mental Status: He is alert and oriented to person, place, and time.  Psychiatric:        Attention and Perception:  Attention normal.        Mood and Affect: Mood normal.        Speech: Speech normal.        Behavior: Behavior is cooperative.     No results found for any visits on 07/26/23.  Assessment & Plan    Primary hypertension Assessment & Plan: Last visit started losartan 25 mg daily  Now more appropriately controlled  Cont as prescribed, f/u 3 mo   Anxiety and depression Assessment & Plan: Tolerating wellbutrin 300 mg well refilled  Orders: -     buPROPion HCl ER (XL); Take 1 tablet (300 mg total) by mouth daily.  Dispense: 90 tablet; Refill: 1    Return in about 3 months (around 10/26/2023) for hypertension, hyperlipidemia, adhd.       Alfredia Ferguson, PA-C  [facility] Primary  Care at [facility] [phone] (phone) [phone] (fax)  [facility]

## 2023-07-27 ENCOUNTER — Telehealth: Payer: Self-pay | Admitting: Emergency Medicine

## 2023-07-27 NOTE — Telephone Encounter (Signed)
 Copied from CRM [id number]. Topic: Clinical - Prescription Issue >> Jul 27, 2023  1:00 PM Theodis Sato wrote: Reason for CRM: Patient is at his [facility] pharmacy right now, his amphetamine-dextroamphetamine (ADDERALL XR) 15 MG 24 hr capsule was sent to [facility], patient wants to know if this can be sent to [facility] as soon as possible so he doesn't have to a another trip.    [facility] [address] - [place], [place] - [address], [address], [place] [address] Phone: [phone]  Fax: [phone]

## 2023-08-02 ENCOUNTER — Other Ambulatory Visit: Payer: Self-pay | Admitting: Physician Assistant

## 2023-08-02 ENCOUNTER — Encounter: Payer: Self-pay | Admitting: Physician Assistant

## 2023-08-02 DIAGNOSIS — F909 Attention-deficit hyperactivity disorder, unspecified type: Secondary | ICD-10-CM

## 2023-08-02 MED ORDER — AMPHETAMINE-DEXTROAMPHET ER 15 MG PO CP24
15.0000 mg | ORAL_CAPSULE | ORAL | 0 refills | Status: DC
Start: 2023-08-02 — End: 2023-09-22

## 2023-08-03 ENCOUNTER — Encounter: Payer: Self-pay | Admitting: Physician Assistant

## 2023-08-17 ENCOUNTER — Encounter: Payer: Self-pay | Admitting: Gastroenterology

## 2023-08-17 ENCOUNTER — Ambulatory Visit: Payer: No Typology Code available for payment source | Admitting: Gastroenterology

## 2023-08-17 VITALS — BP 138/81 | HR 79 | Temp 98.6°F | Resp 21 | Ht 70.0 in | Wt 330.0 lb

## 2023-08-17 DIAGNOSIS — D123 Benign neoplasm of transverse colon: Secondary | ICD-10-CM

## 2023-08-17 DIAGNOSIS — D125 Benign neoplasm of sigmoid colon: Secondary | ICD-10-CM

## 2023-08-17 DIAGNOSIS — K641 Second degree hemorrhoids: Secondary | ICD-10-CM

## 2023-08-17 DIAGNOSIS — Z1211 Encounter for screening for malignant neoplasm of colon: Secondary | ICD-10-CM | POA: Diagnosis present

## 2023-08-17 DIAGNOSIS — K644 Residual hemorrhoidal skin tags: Secondary | ICD-10-CM

## 2023-08-17 DIAGNOSIS — K648 Other hemorrhoids: Secondary | ICD-10-CM

## 2023-08-17 DIAGNOSIS — K635 Polyp of colon: Secondary | ICD-10-CM

## 2023-08-17 DIAGNOSIS — D124 Benign neoplasm of descending colon: Secondary | ICD-10-CM

## 2023-08-17 DIAGNOSIS — K621 Rectal polyp: Secondary | ICD-10-CM

## 2023-08-17 DIAGNOSIS — K562 Volvulus: Secondary | ICD-10-CM | POA: Diagnosis not present

## 2023-08-17 DIAGNOSIS — D120 Benign neoplasm of cecum: Secondary | ICD-10-CM

## 2023-08-17 MED ORDER — SODIUM CHLORIDE 0.9 % IV SOLN: 500.0000 mL | Freq: Once | INTRAVENOUS | Status: DC

## 2023-08-17 NOTE — Progress Notes (Signed)
 GASTROENTEROLOGY PROCEDURE H&P NOTE   Primary Care Physician: Alfredia Ferguson, PA-C    Reason for Procedure:  Colon Cancer screening  Plan:    Colonoscopy  Patient is appropriate for endoscopic procedure(s) in the ambulatory ([facility]) setting.  The nature of the procedure, as well as the risks, benefits, and alternatives were carefully and thoroughly reviewed with the patient. Ample time for discussion and questions allowed. The patient understood, was satisfied, and agreed to proceed.     HPI: Stuart Taylor is a 53 y.o. male who presents for colonoscopy for routine Colon Cancer screening.  No active GI symptoms.  No known family history of colon cancer or related malignancy.  Patient is otherwise without complaints or active issues today.  Past Medical History:  Diagnosis Date   Anxiety and depression    History of chicken pox    Sleep apnea    no CPAP    Past Surgical History:  Procedure Laterality Date   APPENDECTOMY  11/2016   EYE SURGERY     x4, [3]Left, [2] Right   I & D EXTREMITY Left 12/16/2014   Procedure: IRRIGATION AND DEBRIDEMENT EXTREMITY;  Surgeon: Bradly Bienenstock, MD;  Location: [facility] OR;  Service: Orthopedics;  Laterality: Left;   INGUINAL HERNIA REPAIR     as an infant   TONSILLECTOMY      Prior to Admission medications   Medication Sig Start Date End Date Taking? Authorizing Provider  amphetamine-dextroamphetamine (ADDERALL XR) 15 MG 24 hr capsule Take 1 capsule by mouth every morning. 08/02/23  Yes Drubel, Lillia Abed, PA-C  buPROPion (WELLBUTRIN XL) 300 MG 24 hr tablet Take 1 tablet (300 mg total) by mouth daily. 07/26/23  Yes Drubel, Lillia Abed, PA-C  fexofenadine (ALLEGRA) 180 MG tablet Take 180 mg by mouth daily.   Yes [provider]  losartan (COZAAR) 25 MG tablet Take 1 tablet (25 mg total) by mouth daily. 06/24/23  Yes Alfredia Ferguson, PA-C  betamethasone valerate ointment (VALISONE) 0.1 % Apply 1 Application topically daily as needed. 10/06/18    [provider]  clobetasol cream (TEMOVATE) 0.05 % Apply 1 Application topically daily as needed. 12/25/19   [provider]  hydrocortisone 2.5 % cream Apply 1 Application topically daily as needed. 12/25/19   [provider]  Multiple Vitamins-Minerals (MULTIVITAMIN WITH MINERALS) tablet Take 1 tablet by mouth daily.    [provider]    Current Outpatient Medications  Medication Sig Dispense Refill   amphetamine-dextroamphetamine (ADDERALL XR) 15 MG 24 hr capsule Take 1 capsule by mouth every morning. 30 capsule 0   buPROPion (WELLBUTRIN XL) 300 MG 24 hr tablet Take 1 tablet (300 mg total) by mouth daily. 90 tablet 1   fexofenadine (ALLEGRA) 180 MG tablet Take 180 mg by mouth daily.     losartan (COZAAR) 25 MG tablet Take 1 tablet (25 mg total) by mouth daily. 90 tablet 1   betamethasone valerate ointment (VALISONE) 0.1 % Apply 1 Application topically daily as needed.     clobetasol cream (TEMOVATE) 0.05 % Apply 1 Application topically daily as needed.     hydrocortisone 2.5 % cream Apply 1 Application topically daily as needed.     Multiple Vitamins-Minerals (MULTIVITAMIN WITH MINERALS) tablet Take 1 tablet by mouth daily.     Current Facility-Administered Medications  Medication Dose Route Frequency Provider Last Rate Last Admin   0.9 %  sodium chloride infusion  500 mL Intravenous Once Cadin Luka V, DO  Allergies as of 08/17/2023 - Review Complete 08/17/2023  Allergen Reaction Noted   Poison oak extract Anaphylaxis, Rash, and Swelling 06/04/2014    Family History  Problem Relation Age of Onset   Colon polyps Mother    COPD Mother        Living   Anxiety disorder Mother    GER disease Mother    Congestive Heart Failure Mother    Sleep apnea Mother    Arthritis Mother    Kidney disease Mother    Hypothyroidism Mother    Hyperlipidemia Mother    Pulmonary embolism Mother    Anemia Mother    Asthma Mother    Heart attack  Father 58       Deceased   Deep vein thrombosis Father    Cancer Father 41       Ureteral   Allergic rhinitis Father    Mental illness Sister    Eczema Neg Hx    Urticaria Neg Hx    Immunodeficiency Neg Hx    Angioedema Neg Hx    Colon cancer Neg Hx    Esophageal cancer Neg Hx    Rectal cancer Neg Hx    Stomach cancer Neg Hx     Social History   Socioeconomic History   Marital status: Single    Spouse name: Not on file   Number of children: Not on file   Years of education: Not on file   Highest education level: Associate degree: academic program  Occupational History   Not on file  Tobacco Use   Smoking status: Former    Current packs/day: 0.00    Average packs/day: 0.5 packs/day for 7.0 years (3.5 ttl pk-yrs)    Types: Cigarettes    Start date: 04/19/2009    Quit date: 04/19/2016    Years since quitting: 7.3   Smokeless tobacco: Never  Vaping Use   Vaping status: Never Used  Substance and Sexual Activity   Alcohol use: Not Currently    Comment: occa   Drug use: No   Sexual activity: Not Currently  Other Topics Concern   Not on file  Social History Narrative   Not on file   Social Drivers of Health   Financial Resource Strain: Low Risk  (06/23/2023)   Overall Financial Resource Strain (CARDIA)    Difficulty of Paying Living Expenses: Not very hard  Food Insecurity: No Food Insecurity (06/23/2023)   Hunger Vital Sign    Worried About Running Out of Food in the Last Year: Never true    Ran Out of Food in the Last Year: Never true  Transportation Needs: No Transportation Needs (06/23/2023)   PRAPARE - Administrator, Civil Service (Medical): No    Lack of Transportation (Non-Medical): No  Physical Activity: Unknown (06/23/2023)   Exercise Vital Sign    Days of Exercise per Week: 0 days    Minutes of Exercise per Session: Not on file  Stress: Stress Concern Present (06/23/2023)   [employer] of Occupational Health - Occupational Stress Questionnaire     Feeling of Stress : To some extent  Social Connections: Socially Isolated (06/23/2023)   Social Connection and Isolation Panel [NHANES]    Frequency of Communication with Friends and Family: Twice a week    Frequency of Social Gatherings with Friends and Family: Twice a week    Attends Religious Services: Never    Database administrator or Organizations: No    Attends Banker  Meetings: Not on file    Marital Status: Never married  Intimate Partner Violence: Not At Risk (03/09/2022)   Received from [facility]   HITS    Over the last 12 months how often did your partner physically hurt you?: Never    Over the last 12 months how often did your partner insult you or talk down to you?: Never    Over the last 12 months how often did your partner threaten you with physical harm?: Never    Over the last 12 months how often did your partner scream or curse at you?: Never    Physical Exam: Vital signs in last 24 hours: @BP  120/78   Pulse 80   Temp 98.6 F (37 C)   Ht 5\' 10"  (1.778 m)   Wt (!) 330 lb (149.7 kg)   SpO2 97%   BMI 47.35 kg/m  GEN: NAD EYE: Sclerae anicteric ENT: MMM CV: Non-tachycardic Pulm: CTA b/l GI: Soft, NT/ND NEURO:  Alert & Oriented x 3   Doristine Locks, DO [facility]   08/17/2023 1:18 PM

## 2023-08-17 NOTE — Op Note (Signed)
 [facility] Patient Name: Stuart Taylor Procedure Date: 08/17/2023 1:57 PM MRN: [id number] Endoscopist: Doristine Locks , MD, [id number] Age: 53 Referring MD:  Date of Birth: 1970/07/22 Gender: Male Account #: [id number] Procedure:                Colonoscopy Indications:              Screening for colorectal malignant neoplasm, This                            is the patient's first colonoscopy Medicines:                Monitored Anesthesia Care Procedure:                Pre-Anesthesia Assessment:                           - Prior to the procedure, a History and Physical                            was performed, and patient medications and                            allergies were reviewed. The patient's tolerance of                            previous anesthesia was also reviewed. The risks                            and benefits of the procedure and the sedation                            options and risks were discussed with the patient.                            All questions were answered, and informed consent                            was obtained. Prior Anticoagulants: The patient has                            taken no anticoagulant or antiplatelet agents. ASA                            Grade Assessment: III - A patient with severe                            systemic disease. After reviewing the risks and                            benefits, the patient was deemed in satisfactory                            condition to undergo the procedure.  After obtaining informed consent, the colonoscope                            was passed under direct vision. Throughout the                            procedure, the patient's blood pressure, pulse, and                            oxygen saturations were monitored continuously. The                            CF [id number] #[id number] was introduced through the anus                            and advanced to the  the cecum, identified by                            appendiceal orifice and ileocecal valve. The                            colonoscopy was technically difficult and complex                            due to significant looping. The patient tolerated                            the procedure well. The quality of the bowel                            preparation was good. The ileocecal valve,                            appendiceal orifice, and rectum were photographed. Scope In: 2:02:25 PM Scope Out: 2:38:55 PM Scope Withdrawal Time: 0 hours 23 minutes 0 seconds  Total Procedure Duration: 0 hours 36 minutes 30 seconds  Findings:                 Hemorrhoids were found on perianal exam.                           A 4 mm polyp was found in the cecum. The polyp was                            sessile. The polyp was removed with a cold snare.                            Resection and retrieval were complete. Estimated                            blood loss was minimal.                           Seven sessile polyps were found in  the sigmoid                            colon (4), descending colon (2), and transverse                            colon (2). The polyps were 3 to 8 mm in size. These                            polyps were removed with a cold snare. Resection                            and retrieval were complete. Estimated blood loss                            was minimal.                           A 6 mm polyp was found in the rectum. The polyp was                            sessile. The polyp was removed with a cold snare.                            Resection and retrieval were complete. Estimated                            blood loss was minimal.                           Non-bleeding internal hemorrhoids were found during                            retroflexion. The hemorrhoids were small.                           The transverse colon and ascending colon revealed                             significantly excessive looping. Advancing the                            scope required using manual pressure, withdrawing                            and reinserting the scope, water immersion, and                            straightening and shortening the scope to obtain                            bowel loop reduction. Complications:            No immediate complications. Estimated Blood Loss:     Estimated blood loss was minimal. Impression:               -  Hemorrhoids found on perianal exam.                           - One 4 mm polyp in the cecum, removed with a cold                            snare. Resected and retrieved.                           - Seven 3 to 8 mm polyps in the sigmoid colon, in                            the descending colon and in the transverse colon,                            removed with a cold snare. Resected and retrieved.                           - One 6 mm polyp in the rectum, removed with a cold                            snare. Resected and retrieved.                           - Non-bleeding internal hemorrhoids.                           - There was significant looping of the colon. Recommendation:           - Patient has a contact number available for                            emergencies. The signs and symptoms of potential                            delayed complications were discussed with the                            patient. Return to normal activities tomorrow.                            Written discharge instructions were provided to the                            patient.                           - Resume previous diet.                           - Continue present medications.                           - Await pathology results.                           -  Repeat colonoscopy for surveillance based on                            pathology results.                           - Recommend pre-operative placement of abdominal                             binder for next colonoscopy.                           - Return to GI clinic PRN. Doristine Locks, MD 08/17/2023 2:53:06 PM

## 2023-08-17 NOTE — Progress Notes (Signed)
 Drowsy, VSS, resps reg and even. Report to RN

## 2023-08-17 NOTE — Patient Instructions (Signed)

## 2023-08-17 NOTE — Progress Notes (Signed)
 Called to room to assist during endoscopic procedure.  Patient ID and intended procedure confirmed with present staff. Received instructions for my participation in the procedure from the performing physician.

## 2023-08-17 NOTE — Progress Notes (Signed)
 Pt's states no medical or surgical changes since previsit or office visit.

## 2023-08-18 ENCOUNTER — Telehealth: Payer: Self-pay

## 2023-08-18 NOTE — Telephone Encounter (Signed)
  Follow up Call-     08/17/2023   12:52 PM  Call back number  Post procedure Call Back phone  # [phone]  Permission to leave phone message Yes     Patient questions:  Do you have a fever, pain , or abdominal swelling? No. Pain Score  0 *  Have you tolerated food without any problems? Yes.    Have you been able to return to your normal activities? Yes.    Do you have any questions about your discharge instructions: Diet   No. Medications  No. Follow up visit  No.  Do you have questions or concerns about your Care? No.  Actions: * If pain score is 4 or above: No action needed, pain <4.

## 2023-08-22 ENCOUNTER — Encounter: Payer: Self-pay | Admitting: Gastroenterology

## 2023-08-22 LAB — SURGICAL PATHOLOGY

## 2023-09-22 ENCOUNTER — Other Ambulatory Visit: Payer: Self-pay | Admitting: Physician Assistant

## 2023-09-22 DIAGNOSIS — F909 Attention-deficit hyperactivity disorder, unspecified type: Secondary | ICD-10-CM

## 2023-09-22 MED ORDER — AMPHETAMINE-DEXTROAMPHET ER 15 MG PO CP24
15.0000 mg | ORAL_CAPSULE | ORAL | 0 refills | Status: AC
Start: 2023-09-22 — End: ?

## 2023-09-22 NOTE — Telephone Encounter (Signed)
 Requesting: Adderall XR 15mg   Contract: 05/24/23  UDS: 05/24/23 Last Visit: 07/26/23 Next Visit: 10/26/23 Last Refill: 08/02/23 #30 and 0RF   Please Advise

## 2023-10-26 ENCOUNTER — Ambulatory Visit: Admitting: Physician Assistant

## 2023-11-30 ENCOUNTER — Telehealth: Payer: Self-pay | Admitting: Physician Assistant

## 2023-11-30 NOTE — Telephone Encounter (Signed)
Pt scheduled for 9:40 tomorrow

## 2023-11-30 NOTE — Telephone Encounter (Signed)
 Copied from CRM [id number]. Topic: Appointments - Scheduling Inquiry for Clinic >> Nov 30, 2023  1:27 PM Rea C wrote: Reason for CRM: Patient called in and scheduled an appointment for his mom tomorrow at 11 with Manuelita Flatness.   He is a patient of Ms. Drubel's and he is wondering if he can be double booked with his mom at 70 because they are both experiencing cold/flu like symptoms and live in the same household. Patient contact is [phone] (M).

## 2023-12-01 ENCOUNTER — Ambulatory Visit (INDEPENDENT_AMBULATORY_CARE_PROVIDER_SITE_OTHER): Admitting: Physician Assistant

## 2023-12-01 VITALS — BP 139/79 | HR 92 | Temp 98.2°F | Ht 70.0 in | Wt 343.8 lb

## 2023-12-01 DIAGNOSIS — R051 Acute cough: Secondary | ICD-10-CM | POA: Diagnosis not present

## 2023-12-01 DIAGNOSIS — J0100 Acute maxillary sinusitis, unspecified: Secondary | ICD-10-CM

## 2023-12-01 MED ORDER — BENZONATATE 100 MG PO CAPS: 100.0000 mg | ORAL_CAPSULE | Freq: Two times a day (BID) | ORAL | 0 refills | Status: DC | PRN

## 2023-12-01 MED ORDER — AMOXICILLIN 875 MG PO TABS
875.0000 mg | ORAL_TABLET | Freq: Two times a day (BID) | ORAL | 0 refills | Status: AC
Start: 2023-12-01 — End: 2023-12-08

## 2023-12-01 NOTE — Progress Notes (Unsigned)
      Established patient visit   Patient: Stuart Taylor   DOB: March 24, 1971   53 y.o. Male  MRN: [id number] Visit Date: 12/01/2023  Today's healthcare provider: Manuelita Flatness, PA-C   No chief complaint on file.  Subjective     ***  Medications: Outpatient Medications Prior to Visit  Medication Sig   amphetamine -dextroamphetamine (ADDERALL XR) 15 MG 24 hr capsule Take 1 capsule by mouth every morning.   betamethasone valerate ointment (VALISONE) 0.1 % Apply 1 Application topically daily as needed.   buPROPion  (WELLBUTRIN  XL) 300 MG 24 hr tablet Take 1 tablet (300 mg total) by mouth daily.   clobetasol cream (TEMOVATE) 0.05 % Apply 1 Application topically daily as needed.   fexofenadine (ALLEGRA) 180 MG tablet Take 180 mg by mouth daily.   hydrocortisone  2.5 % cream Apply 1 Application topically daily as needed.   losartan  (COZAAR ) 25 MG tablet Take 1 tablet (25 mg total) by mouth daily.   Multiple Vitamins-Minerals (MULTIVITAMIN WITH MINERALS) tablet Take 1 tablet by mouth daily.   No facility-administered medications prior to visit.    Review of Systems {Insert previous labs (optional):23779} {See past labs  Heme  Chem  Endocrine  Serology  Results Review (optional):1}   Objective    BP 139/79   Pulse 92   Temp 98.2 F (36.8 C)   Ht 5' 10 (1.778 m)   Wt (!) 343 lb 12.8 oz (155.9 kg)   SpO2 94%   BMI 49.33 kg/m  {Insert last BP/Wt (optional):23777}{See vitals history (optional):1}  Physical Exam Constitutional:      General: He is awake.     Appearance: He is well-developed.  HENT:     Head: Normocephalic.     Right Ear: Tympanic membrane normal.     Left Ear: Tympanic membrane normal.  Eyes:     Conjunctiva/sclera: Conjunctivae normal.  Cardiovascular:     Rate and Rhythm: Normal rate and regular rhythm.     Heart sounds: Normal heart sounds.  Pulmonary:     Effort: Pulmonary effort is normal.     Breath sounds: Normal breath sounds. No wheezing,  rhonchi or rales.  Skin:    General: Skin is warm.  Neurological:     Mental Status: He is alert and oriented to person, place, and time.  Psychiatric:        Attention and Perception: Attention normal.        Mood and Affect: Mood normal.        Speech: Speech normal.        Behavior: Behavior is cooperative.     ***  No results found for any visits on 12/01/23.  Assessment & Plan    There are no diagnoses linked to this encounter.  ***  No follow-ups on file.       Manuelita Flatness, PA-C  [facility] [phone] (phone) [phone] (fax)  [facility]

## 2023-12-02 ENCOUNTER — Encounter: Payer: Self-pay | Admitting: Physician Assistant

## 2024-03-10 ENCOUNTER — Ambulatory Visit
Admission: RE | Admit: 2024-03-10 | Discharge: 2024-03-10 | Disposition: A | Discharge status: Home / Self Care | Source: Ambulatory Visit | Attending: Family Medicine | Admitting: Family Medicine

## 2024-03-10 ENCOUNTER — Ambulatory Visit

## 2024-03-10 VITALS — BP 134/82 | HR 77 | Temp 98.1°F | Resp 18 | Ht 70.0 in

## 2024-03-10 DIAGNOSIS — R0602 Shortness of breath: Secondary | ICD-10-CM

## 2024-03-10 DIAGNOSIS — R600 Localized edema: Secondary | ICD-10-CM

## 2024-03-10 MED ORDER — FUROSEMIDE 20 MG PO TABS: 20.0000 mg | ORAL_TABLET | Freq: Every day | ORAL | 0 refills | Status: AC

## 2024-03-10 NOTE — Discharge Instructions (Addendum)
 Take the Lasix once a day.  Do this for 3 to 7 days until your excess fluid is eliminated.  May use as needed. Make sure that you eat potassium foods while on Lasix You need to follow-up with your primary care doctor Check your test results on MyChart

## 2024-03-10 NOTE — ED Triage Notes (Signed)
 Patient c/o bilateral leg swelling, left lower leg is worse, swelling in legs for several weeks.  Patient does have a PCP and unable to see due to new job.  Swelling is worse at night.

## 2024-03-10 NOTE — ED Provider Notes (Signed)
 [facility]    CSN: [id number] Arrival date & time: 03/10/24  1415      History   Chief Complaint Chief Complaint  Patient presents with   Bilateral Leg Swelling    HPI Stuart Taylor is a 53 y.o. male.   Patient states he has been unable to see his usual primary care doctor because he started a new job and does not have any time off Monday through Friday.  He is here today for evaluation.  He has swelling in both of his ankles.  He states this happens to him periodically.  He feels like it happens more often in the fall.  This time, however, they are more swollen than usual.  Very uncomfortable.  He feels some swelling in his hands.  Feels some shortness of breath with exertion.  Denies any chest pain. Patient does have hypertension hyperlipidemia morbid obesity anxiety and depression.     Past Medical History:  Diagnosis Date   Anxiety and depression    History of chicken pox    Sleep apnea    no CPAP    Patient Active Problem List   Diagnosis Date Noted   Attention deficit hyperactivity disorder (ADHD) 05/25/2023   Primary hypertension 05/25/2023   Vitamin D  deficiency 08/10/2019   Perennial and seasonal allergic rhinitis 12/22/2017   Morbid obesity (HCC) 04/27/2017   OSA (obstructive sleep apnea) 09/20/2016   Benign prostatic hyperplasia (BPH) with urinary urgency 07/05/2014   First degree hemorrhoids 07/05/2014   Anxiety and depression 06/04/2014   MRSA carrier 06/04/2014    Past Surgical History:  Procedure Laterality Date   APPENDECTOMY  11/2016   EYE SURGERY     x4, [3]Left, [2] Right   I & D EXTREMITY Left 12/16/2014   Procedure: IRRIGATION AND DEBRIDEMENT EXTREMITY;  Surgeon: Prentice Pagan, MD;  Location: [facility] OR;  Service: Orthopedics;  Laterality: Left;   INGUINAL HERNIA REPAIR     as an infant   TONSILLECTOMY         Home Medications    Prior to Admission medications   Medication Sig Start Date End Date Taking? Authorizing  Provider  amphetamine -dextroamphetamine (ADDERALL XR) 15 MG 24 hr capsule Take 1 capsule by mouth every morning. 09/22/23  Yes Cyndi Shaver, PA-C  aspirin EC 325 MG tablet Take 325 mg by mouth daily.   Yes [provider]  furosemide (LASIX) 20 MG tablet Take 1 tablet (20 mg total) by mouth daily. 03/10/24  Yes Maranda Jamee Jacob, MD  losartan  (COZAAR ) 25 MG tablet Take 1 tablet (25 mg total) by mouth daily. 06/24/23  Yes Cyndi Shaver, PA-C    Family History Family History  Problem Relation Age of Onset   Colon polyps Mother    COPD Mother        Living   Anxiety disorder Mother    GER disease Mother    Congestive Heart Failure Mother    Sleep apnea Mother    Arthritis Mother    Kidney disease Mother    Hypothyroidism Mother    Hyperlipidemia Mother    Pulmonary embolism Mother    Anemia Mother    Asthma Mother    Heart attack Father 79       Deceased   Deep vein thrombosis Father    Cancer Father 5       Ureteral   Allergic rhinitis Father    Mental illness Sister    Eczema Neg Hx    Urticaria Neg  Hx    Immunodeficiency Neg Hx    Angioedema Neg Hx    Colon cancer Neg Hx    Esophageal cancer Neg Hx    Rectal cancer Neg Hx    Stomach cancer Neg Hx     Social History Social History   Tobacco Use   Smoking status: Former    Current packs/day: 0.00    Average packs/day: 0.5 packs/day for 7.0 years (3.5 ttl pk-yrs)    Types: Cigarettes    Start date: 04/19/2009    Quit date: 04/19/2016    Years since quitting: 7.8   Smokeless tobacco: Never  Vaping Use   Vaping status: Never Used  Substance Use Topics   Alcohol use: Not Currently    Comment: occa   Drug use: No     Allergies   Poison oak extract   Review of Systems Review of Systems See HPI  Physical Exam Triage Vital Signs ED Triage Vitals  Encounter Vitals Group     BP 03/10/24 1429 134/82     Girls Systolic BP Percentile --      Girls Diastolic BP Percentile --      Boys Systolic BP  Percentile --      Boys Diastolic BP Percentile --      Pulse Rate 03/10/24 1429 77     Resp 03/10/24 1429 18     Temp 03/10/24 1429 98.1 F (36.7 C)     Temp Source 03/10/24 1429 Oral     SpO2 03/10/24 1429 93 %     Weight --      Height 03/10/24 1427 5' 10 (1.778 m)     Head Circumference --      Peak Flow --      Pain Score 03/10/24 1427 0     Pain Loc --      Pain Education --      Exclude from Growth Chart --    No data found.  Updated Vital Signs BP 134/82 (BP Location: Right Arm)   Pulse 77   Temp 98.1 F (36.7 C) (Oral)   Resp 18   Ht 5' 10 (1.778 m)   SpO2 93%   BMI 49.33 kg/m     Physical Exam Constitutional:      General: He is not in acute distress.    Appearance: He is well-developed. He is obese.  HENT:     Head: Normocephalic and atraumatic.     Right Ear: Tympanic membrane normal.     Left Ear: Tympanic membrane normal.     Nose: Nose normal. No congestion.     Mouth/Throat:     Mouth: Mucous membranes are moist.     Pharynx: No posterior oropharyngeal erythema.  Eyes:     Conjunctiva/sclera: Conjunctivae normal.     Pupils: Pupils are equal, round, and reactive to light.  Cardiovascular:     Rate and Rhythm: Normal rate and regular rhythm.     Heart sounds: Normal heart sounds.  Pulmonary:     Effort: Pulmonary effort is normal. No respiratory distress.     Breath sounds: Normal breath sounds. No rales.     Comments: Diminished breath sound due to body habitus Musculoskeletal:        General: Normal range of motion.     Cervical back: Normal range of motion.     Right lower leg: No edema.     Left lower leg: Edema present.     Comments: Pitting edema to patella  Skin:    General: Skin is warm and dry.  Neurological:     Mental Status: He is alert.      UC Treatments / Results  Labs (all labs ordered are listed, but only abnormal results are displayed) Labs Reviewed  CBC WITH DIFFERENTIAL/PLATELET  COMPREHENSIVE METABOLIC PANEL  WITH GFR  TSH  BRAIN NATRIURETIC PEPTIDE    EKG   Radiology DG Chest 2 View Result Date: 03/10/2024 EXAM: 2 VIEW(S) XRAY OF THE CHEST 03/10/2024 03:15:00 PM COMPARISON: 02/17/2023 CLINICAL HISTORY: SOB/edema. 53 year-old male c/o SOB and bilat leg edema x 2+ weeks. FINDINGS: LUNGS AND PLEURA: No focal pulmonary opacity. No pulmonary edema. No pleural effusion. No pneumothorax. HEART AND MEDIASTINUM: No acute abnormality of the cardiac and mediastinal silhouettes. BONES AND SOFT TISSUES: No acute osseous abnormality. IMPRESSION: 1. No acute cardiopulmonary process. Electronically signed by: Norleen Boxer MD 03/10/2024 03:43 PM EDT RP Workstation: [id number]    Procedures Procedures (including critical care time)  Medications Ordered in UC Medications - No data to display  Initial Impression / Assessment and Plan / UC Course  I have reviewed the triage vital signs and the nursing notes.  Pertinent labs & imaging results that were available during my care of the patient were reviewed by me and considered in my medical decision making (see chart for details).     Explained to patient that the workup for pedal edema, especially the severe, includes liver and kidney disease, heart failure, metabolic disease, diabetes, circulatory problems, venous varicosities.  His chest x-ray and EKG are normal today.  Will do blood work and give him a trial of Lasix.  He really needs to follow-up with his PCP to complete his workup. Final Clinical Impressions(s) / UC Diagnoses   Final diagnoses:  SOB (shortness of breath) on exertion  Pedal edema     Discharge Instructions      Take the Lasix once a day.  Do this for 3 to 7 days until your excess fluid is eliminated.  May use as needed. Make sure that you eat potassium foods while on Lasix You need to follow-up with your primary care doctor Check your test results on MyChart   ED Prescriptions     Medication Sig Dispense Auth. Provider    furosemide (LASIX) 20 MG tablet Take 1 tablet (20 mg total) by mouth daily. 30 tablet Maranda Jamee Jacob, MD      PDMP not reviewed this encounter.   Maranda Jamee Jacob, MD 03/10/24 [phone]

## 2024-03-11 ENCOUNTER — Telehealth: Payer: Self-pay | Admitting: Emergency Medicine

## 2024-03-11 ENCOUNTER — Ambulatory Visit: Payer: Self-pay | Admitting: Family Medicine

## 2024-03-11 LAB — COMPREHENSIVE METABOLIC PANEL WITH GFR
ALT: 44 IU/L (ref 0–44)
AST: 45 IU/L — ABNORMAL HIGH (ref 0–40)
Albumin: 4.3 g/dL (ref 3.8–4.9)
Alkaline Phosphatase: 82 IU/L (ref 47–123)
BUN/Creatinine Ratio: 8 — ABNORMAL LOW (ref 9–20)
BUN: 9 mg/dL (ref 6–24)
Bilirubin Total: 0.6 mg/dL (ref 0.0–1.2)
CO2: 25 mmol/L (ref 20–29)
Calcium: 9.5 mg/dL (ref 8.7–10.2)
Chloride: 102 mmol/L (ref 96–106)
Creatinine, Ser: 1.06 mg/dL (ref 0.76–1.27)
Globulin, Total: 2.3 g/dL (ref 1.5–4.5)
Glucose: 101 mg/dL — ABNORMAL HIGH (ref 70–99)
Potassium: 4.3 mmol/L (ref 3.5–5.2)
Sodium: 140 mmol/L (ref 134–144)
Total Protein: 6.6 g/dL (ref 6.0–8.5)
eGFR: 84 mL/min/1.73 (ref >59)

## 2024-03-11 LAB — CBC WITH DIFFERENTIAL/PLATELET
Basophils Absolute: 0 x10E3/uL (ref 0.0–0.2)
Basos: 1 %
EOS (ABSOLUTE): 0.2 x10E3/uL (ref 0.0–0.4)
Eos: 5 %
Hematocrit: 48.3 % (ref 37.5–51.0)
Hemoglobin: 16.3 g/dL (ref 13.0–17.7)
Immature Grans (Abs): 0 x10E3/uL (ref 0.0–0.1)
Immature Granulocytes: 0 %
Lymphocytes Absolute: 0.8 x10E3/uL (ref 0.7–3.1)
Lymphs: 19 %
MCH: 30.2 pg (ref 26.6–33.0)
MCHC: 33.7 g/dL (ref 31.5–35.7)
MCV: 89 fL (ref 79–97)
Monocytes Absolute: 0.3 x10E3/uL (ref 0.1–0.9)
Monocytes: 8 %
Neutrophils Absolute: 2.8 x10E3/uL (ref 1.4–7.0)
Neutrophils: 66 %
Platelets: 178 x10E3/uL (ref 150–450)
RBC: 5.4 x10E6/uL (ref 4.14–5.80)
RDW: 13.5 % (ref 11.6–15.4)
WBC: 4.2 x10E3/uL (ref 3.4–10.8)

## 2024-03-11 LAB — TSH: TSH: 1.38 u[IU]/mL (ref 0.450–4.500)

## 2024-03-11 NOTE — Telephone Encounter (Signed)
 LMTRC.  Advised if doing well to disregard the call, any questions or concerns don't hesitate to contact the office.

## 2024-03-12 LAB — BRAIN NATRIURETIC PEPTIDE: BNP: 43.1 pg/mL (ref 0.0–100.0)
